# Patient Record
Sex: Male | Born: 1965 | Race: White | Hispanic: No | Marital: Married | Smoking: Never smoker
Health system: Southern US, Community
[De-identification: ages and names within clinical notes are randomized; demographics above are authoritative.]

## PROBLEM LIST (undated history)

## (undated) DIAGNOSIS — T4145XA Adverse effect of unspecified anesthetic, initial encounter: Secondary | ICD-10-CM

## (undated) DIAGNOSIS — E785 Hyperlipidemia, unspecified: Secondary | ICD-10-CM

## (undated) DIAGNOSIS — T8859XA Other complications of anesthesia, initial encounter: Secondary | ICD-10-CM

## (undated) HISTORY — DX: Hyperlipidemia, unspecified: E78.5

---
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
## 2010-04-08 ENCOUNTER — Ambulatory Visit: Payer: Self-pay | Admitting: Internal Medicine

## 2010-05-04 DIAGNOSIS — F411 Generalized anxiety disorder: Secondary | ICD-10-CM | POA: Insufficient documentation

## 2010-05-04 DIAGNOSIS — E785 Hyperlipidemia, unspecified: Secondary | ICD-10-CM | POA: Insufficient documentation

## 2010-05-05 DIAGNOSIS — J45909 Unspecified asthma, uncomplicated: Secondary | ICD-10-CM | POA: Insufficient documentation

## 2010-05-19 NOTE — Assessment & Plan Note (Signed)
Summary: Pulmoary consultation/ cough ? how much asthma   Visit Type:  Initial Consult Copy to:  Dr. Lemont Fillers Primary Provider/Referring Provider:  Dr. Jackalyn Lombard  CC:  Cough.  History of Present Illness: 50 yowm never smoker with h/o allergies / asthma stopped around the 15 learned to control it by avoiding irritants like cats/dogs/livestock  hay but still needed a as needed inhaler (no more than 2-3 x yearly and maybe prednsone for a week at  most)  May 05, 2010   1st pulmonary office eval for new cough since Oct 2011 attributed to cold rx sev rounds of  abx and  prednisone plus new rx with singulair, allegra, symbiocort and 100% better until traveled to [place] but when arrived back in [place] relapsed again with main c/o cough rattling minimal mucus clear.  no better with saba.  Pt denies any significant sore throat, dysphagia, itching, sneezing,  nasal congestion or excess secretions,  fever, chills, sweats, unintended wt loss, pleuritic or exertional cp, hempoptysis, change in activity tolerance  orthopnea pnd or leg swelling. Pt also denies any obvious fluctuation in symptoms with weather or environmental change or other alleviating or aggravating factors.       Current Medications (verified): 1)  Proair Hfa 108 (90 Base) Mcg/act Aers (Albuterol Sulfate) .... 2 Puffs Every 4-6 Hrs As Needed 2)  Fluticasone Propionate 50 Mcg/act Susp (Fluticasone Propionate) .... 2 Sprays Each Nostril Every Am 3)  Singulair 10 Mg Tabs (Montelukast Sodium) .Marland Kitchen.. 1 Once Daily 4)  Allegra Allergy 60 Mg Tabs (Fexofenadine Hcl) .Marland Kitchen.. 1 Once Daily 5)  Symbicort 160-4.5 Mcg/act Aero (Budesonide-Formoterol Fumarate) .... 2 Puffs Two Times A Day  Allergies (verified): No Known Drug Allergies  Past History:  Past Medical History: Asthma Hyperlipidemia  Family History: Asthma- Paternal Uncle  Social History: Married Children Never smoked cigs.  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  The patient denies coughing up blood, chest pain, irregular heartbeats, acid heartburn, indigestion, loss of appetite, weight change, abdominal pain, difficulty swallowing, sore throat, tooth/dental problems, headaches, sneezing, itching, ear ache, anxiety, depression, hand/feet swelling, joint stiffness or pain, rash, change in color of mucus, and fever.    Vital Signs:  Patient profile:   45 year old male Height:      70 inches Weight:      225 pounds BMI:     32.40 O2 Sat:      97 % on Room air Temp:     97.8 degrees F oral Pulse rate:   82 / minute BP sitting:   124 / 88  (left arm)  Vitals Entered By: Vernie Murders (May 05, 2010 11:33 AM)  O2 Flow:  Room air  Physical Exam  Additional Exam:  Pleasant healthy appearing mildly hoarse wm nad HEENT: nl dentition, turbinates, and orophanx. Nl external ear canals without cough reflex NECK :  without JVD/Nodes/TM/ nl carotid upstrokes bilaterally LUNGS: no acc muscle use, clear to A and P bilaterally without cough on insp or exp maneuvers CV:  RRR  no s3 or murmur or increase in P2, no edema  ABD:  soft and nontender with nl excursion in the supine position. No bruits or organomegaly, bowel sounds nl MS:  warm without  deformities, calf tenderness, cyanosis or clubbing SKIN: warm and dry without lesions   NEURO:  alert, approp, no deficits     Impression & Recommendations:  Problem # 1:  ASTHMA (ICD-493.90)  Previously only intermittent and well controlled, now chronic and poorly controlled ? why?  DDX of  difficult airways managment all start with A and  include Adherence, Ace Inhibitors, Acid Reflux, Active Sinus Disease, Alpha 1 Antitripsin deficiency, Anxiety  masquerading as Airways dz,  ABPA,  allergy(esp in young), Aspiration (esp in elderly), Adverse effects of DPI,  Active smokers, plus one B  = Beta blocker use..  and one C=CHF  Adherence:  I spent extra time with the patient today explaining optimal mdi  technique.  This improved from  25-75%  ? acid reflux:  always a concern. See instructions for specific recommendations   ? Active sinus dz > sinus ct next ? allergy> allergy profile next, clearly not singulair responsive so stop this.   ? anxiety: dx of exclusion  Medications Added to Medication List This Visit: 1)  Proair Hfa 108 (90 Base) Mcg/act Aers (Albuterol sulfate) .... 2 puffs every 4-6 hrs as needed 2)  Fluticasone Propionate 50 Mcg/act Susp (Fluticasone propionate) .... 2 sprays each nostril every am 3)  Singulair 10 Mg Tabs (Montelukast sodium) .Marland Kitchen.. 1 once daily 4)  Allegra Allergy 60 Mg Tabs (Fexofenadine hcl) .Marland Kitchen.. 1 once daily 5)  Symbicort 160-4.5 Mcg/act Aero (Budesonide-formoterol fumarate) .... 2 puffs two times a day 6)  Dulera 100-5 Mcg/act Aero (Mometasone furo-formoterol fum) .... 2 puffs first thing  in am and 2 puffs again in pm about 12 hours later  Other Orders: Consultation Level V (16109)  Patient Instructions: 1)  Dulera 100 mg 2 puffs first thing  in am and 2 puffs again in pm about 12 hours later and blow back out through nose 2)  Prilosec before bfast and pepcid 20 mg at bedtime as long as coughing ( reflux is to cough what oxygen is to fire)  3)  GERD (REFLUX)  is a common cause of respiratory symptoms. It commonly presents without heartburn and can be treated with medication, but also with lifestyle changes including avoidance of late meals, excessive alcohol, smoking cessation, and avoid fatty foods, chocolate, peppermint, colas, red wine, and acidic juices such as orange juice. NO MINT OR MENTHOL PRODUCTS SO NO COUGH DROPS  4)  USE SUGARLESS CANDY INSTEAD (jolley ranchers)  5)  NO OIL BASED VITAMINS    6)  Delsym 2 tsp every 12 hours as needed for cough

## 2010-06-09 ENCOUNTER — Ambulatory Visit: Payer: Self-pay | Admitting: Internal Medicine

## 2011-01-18 ENCOUNTER — Ambulatory Visit (INDEPENDENT_AMBULATORY_CARE_PROVIDER_SITE_OTHER): Payer: BC Managed Care – PPO | Admitting: Adult Health

## 2011-01-18 ENCOUNTER — Encounter: Payer: Self-pay | Admitting: Adult Health

## 2011-01-18 VITALS — BP 128/78 | HR 75 | Temp 97.5°F | Ht 70.0 in | Wt 225.6 lb

## 2011-01-18 DIAGNOSIS — J45909 Unspecified asthma, uncomplicated: Secondary | ICD-10-CM

## 2011-01-18 MED ORDER — HYDROCODONE-HOMATROPINE 5-1.5 MG/5ML PO SYRP: 5.0000 mL | ORAL_SOLUTION | Freq: Four times a day (QID) | ORAL | Status: AC | PRN

## 2011-01-18 MED ORDER — ALBUTEROL SULFATE HFA 108 (90 BASE) MCG/ACT IN AERS: 2.0000 | INHALATION_SPRAY | RESPIRATORY_TRACT | Status: DC | PRN

## 2011-01-18 MED ORDER — FLUTICASONE PROPIONATE 50 MCG/ACT NA SUSP: 2.0000 | Freq: Every day | NASAL | Status: DC

## 2011-01-18 MED ORDER — MOMETASONE FURO-FORMOTEROL FUM 100-5 MCG/ACT IN AERO: 2.0000 | INHALATION_SPRAY | Freq: Two times a day (BID) | RESPIRATORY_TRACT | Status: DC

## 2011-01-18 NOTE — Patient Instructions (Signed)
Remain on  Dulera 2 puffs Twice daily  -brush/rinse and gargle after use.  Delsym 2 tsp Twice daily  As needed  Cough  Hydromet 1-2 tsp every 6 hr As needed  Cough, may make you sleepy.  Fluids and rest  Zyrtec 10mg  At bedtime  As needed  Drainage.  follow up Dr. Sherene Sires  In 4 -6 weeks with PFTs  Please contact office for sooner follow up if symptoms do not improve or worsen or seek emergency care

## 2011-01-25 NOTE — Assessment & Plan Note (Signed)
Mild flare   Plan;  Remain on  Dulera 2 puffs Twice daily  -brush/rinse and gargle after use.  Delsym 2 tsp Twice daily  As needed  Cough  Hydromet 1-2 tsp every 6 hr As needed  Cough, may make you sleepy.  Fluids and rest  Zyrtec 10mg  At bedtime  As needed  Drainage.  follow up Dr. Sherene Sires  In 4 -6 weeks with PFTs  Please contact office for sooner follow up if symptoms do not improve or worsen or seek emergency care

## 2011-01-25 NOTE — Progress Notes (Signed)
  Subjective:    Patient ID: Byford Schools, male    DOB: 21-Aug-1965, 45 y.o.   MRN: [id number]  HPI 87 yowm never smoker with h/o allergies / asthma stopped around the 15 learned to control it by avoiding irritants like cats/dogs/livestock hay but still needed a as needed inhaler (no more than 2-3 x yearly and maybe prednsone for a week at most)   May 05, 2010 1st pulmonary office eval for new cough since Oct 2011 attributed to cold rx sev rounds of abx and prednisone plus new rx with singulair, allegra, symbiocort and 100% better until traveled to [place] but when arrived back in [place] relapsed again with main c/o cough rattling minimal mucus clear. no better with saba.   >>Dulera rx   01/18/2011 Acute OV  Complains of cold symptoms that started last week. Cough is worse at night, keeping her up. Mostly non prod with some chest tightness and wheezing. She restarted Dulera and flonase 3 days ago which are helping. No discolored mucus. No fever or recent travel or abx use. Did welll last visit then stopped Dulera.    Review of Systems Constitutional:   No  weight loss, night sweats,  Fevers, chills, fatigue, or  lassitude.  HEENT:   No headaches,  Difficulty swallowing,  Tooth/dental problems, or  Sore throat,                No sneezing, itching, ear ache,  +nasal congestion, post nasal drip,   CV:  No chest pain,  Orthopnea, PND, swelling in lower extremities, anasarca, dizziness, palpitations, syncope.   GI  No heartburn, indigestion, abdominal pain, nausea, vomiting, diarrhea, change in bowel habits, loss of appetite, bloody stools.   Resp:    No coughing up of blood.   No chest wall deformity  Skin: no rash or lesions.  GU: no dysuria, change in color of urine, no urgency or frequency.  No flank pain, no hematuria   MS:  No joint pain or swelling.  No decreased range of motion.  No back pain.  Psych:  No change in mood or affect. No depression or anxiety.  No memory loss.         Objective:   Physical Exam GEN: A/Ox3; pleasant , NAD,    HEENT:  [place]/AT,  EACs-clear, TMs-wnl, NOSE-clear, THROAT-clear, no lesions, no postnasal drip or exudate noted.   NECK:  Supple w/ fair ROM; no JVD; normal carotid impulses w/o bruits; no thyromegaly or nodules palpated; no lymphadenopathy.  RESP  Coarse BS  w/o, wheezes/ rales/ or rhonchi.no accessory muscle use, no dullness to percussion  CARD:  RRR, no m/r/g  , no peripheral edema, pulses intact, no cyanosis or clubbing.  GI:   Soft & nt; nml bowel sounds; no organomegaly or masses detected.  Musco: Warm bil, no deformities or joint swelling noted.   Neuro: alert, no focal deficits noted.    Skin: Warm, no lesions or rashes         Assessment & Plan:

## 2011-03-06 ENCOUNTER — Ambulatory Visit: Payer: BC Managed Care – PPO | Admitting: Internal Medicine

## 2011-08-09 ENCOUNTER — Encounter: Payer: Self-pay | Admitting: Internal Medicine

## 2011-08-09 ENCOUNTER — Ambulatory Visit (INDEPENDENT_AMBULATORY_CARE_PROVIDER_SITE_OTHER): Payer: BC Managed Care – PPO | Admitting: Internal Medicine

## 2011-08-09 VITALS — BP 110/70 | HR 81 | Temp 98.2°F | Ht 70.0 in | Wt 221.0 lb

## 2011-08-09 DIAGNOSIS — J45909 Unspecified asthma, uncomplicated: Secondary | ICD-10-CM

## 2011-08-09 MED ORDER — MOMETASONE FURO-FORMOTEROL FUM 200-5 MCG/ACT IN AERO: INHALATION_SPRAY | RESPIRATORY_TRACT | Status: DC

## 2011-08-09 MED ORDER — PREDNISONE (PAK) 10 MG PO TABS: ORAL_TABLET | ORAL | Status: AC

## 2011-08-09 NOTE — Patient Instructions (Addendum)
Dulera 200 Take 2 puffs first thing in am and then another 2 puffs about 12 hours later at onset of the cough  Prednisone 10 mg take  4 each am x 2 days,   2 each am x 2 days,  1 each am x2days and stop   If cough persists despite the prednisone, start Try prilosec 20mg   Take 30-60 min before first meal of the day and Pepcid 20 mg one bedtime until cough is completely gone for at least a week without the need for cough suppression   If you are satisfied with your treatment plan let your doctor know and he/she can either refill your medications or you can return here when your prescription runs out.     If in any way you are not 100% satisfied,  please tell us.  If 100% better, tell your friends!

## 2011-08-09 NOTE — Progress Notes (Signed)
  Subjective:    Patient ID: Willie Jenkins, male    DOB: [birth year]    MRN: [id number]  HPI 57  yowm never smoker with h/o allergies / asthma stopped around the age of 26 learned to control it by avoiding irritants like cats/dogs/livestock hay but still needed prn  inhaler (no more than 2-3 x yearly and maybe prednsone for a week at most).  May 05, 2010 1st pulmonary office eval for new cough since Oct 2011 attributed to cold rx sev rounds of abx and prednisone plus new rx with singulair, allegra, symbiocort and 100% better until traveled to [place] but when arrived back in [place] relapsed again with main c/o cough rattling minimal mucus clear. no better with saba.   >>Dulera rx     08/09/2011 f/u ov/Shalah Estelle excellent symptom control with no need for maint rx cc abuptly ill x one month nasal congestion, sore throat,  Then increase cough min prod beige mucus started right away on dulera 100 2 bid and although consistent technique x one week prior to OV till feel need for rescue saba twice daily with no overt hb or reflux symptoms  Sleeping ok without nocturnal  or early am exacerbation  of respiratory  c/o's or need for noct saba. Also denies any obvious fluctuation of symptoms with weather or environmental changes or other aggravating or alleviating factors except as outlined above   ROS  At present neg for  any significant sore throat, dysphagia, dental problems, itching, sneezing,  nasal congestion or excess/ purulent secretions, ear ache,   fever, chills, sweats, unintended wt loss, pleuritic or exertional cp, hemoptysis, palpitations, orthopnea pnd or leg swelling.  Also denies presyncope, palpitations, heartburn, abdominal pain, anorexia, nausea, vomiting, diarrhea  or change in bowel or urinary habits, change in stools or urine, dysuria,hematuria,  rash, arthralgias, visual complaints, headache, numbness weakness or ataxia or problems with walking or coordination. No noted change in mood/affect or  memory.           Objective:   Physical Exam GEN: A/Ox3; pleasant , NAD,    HEENT:  [place]/AT,  EACs-clear, TMs-wnl, NOSE-clear, THROAT-clear, no lesions, no postnasal drip or exudate noted.   NECK:  Supple w/ fair ROM; no JVD; normal carotid impulses w/o bruits; no thyromegaly or nodules palpated; no lymphadenopathy.  RESP   Trace wheezes/ rales/ or rhonchi.no accessory muscle use, no dullness to percussion  CARD:  RRR, no m/r/g  , no peripheral edema, pulses intact, no cyanosis or clubbing.  GI:   Soft & nt; nml bowel sounds; no organomegaly or masses detected.  Musco: Warm bil, no deformities or joint swelling noted.   Neuro: alert, no focal deficits noted.    Skin: Warm, no lesions or rashes         Assessment & Plan:

## 2011-08-10 NOTE — Assessment & Plan Note (Addendum)
Acute exac not responding to dulera 100 2bid  DDX of  difficult airways managment all start with A and  include Adherence, Ace Inhibitors, Acid Reflux, Active Sinus Disease, Alpha 1 Antitripsin deficiency, Anxiety masquerading as Airways dz,  ABPA,  allergy(esp in young), Aspiration (esp in elderly), Adverse effects of DPI,  Active smokers, plus two Bs  = Bronchiectasis and Beta blocker use..and one C= CHF  The proper method of use, as well as anticipated side effects, of a metered-dose inhaler are discussed and demonstrated to the patient. Improved effectiveness after extensive coaching during this visit to a level of approximately  90% - try dulera 200 but note risk cough with higher dose ICS due to irritation of upper airway outweighing benefit to lower airway  ? Acid reflux > reviewed possible need for gerd rx if symptoms continue ? Anxiety > dx of exclusion

## 2011-08-25 ENCOUNTER — Ambulatory Visit (INDEPENDENT_AMBULATORY_CARE_PROVIDER_SITE_OTHER)
Admission: RE | Admit: 2011-08-25 | Discharge: 2011-08-25 | Disposition: A | Discharge status: Home / Self Care | Payer: BC Managed Care – PPO | Source: Ambulatory Visit | Attending: Adult Health | Admitting: Adult Health

## 2011-08-25 ENCOUNTER — Ambulatory Visit (INDEPENDENT_AMBULATORY_CARE_PROVIDER_SITE_OTHER): Payer: BC Managed Care – PPO | Admitting: Adult Health

## 2011-08-25 ENCOUNTER — Encounter: Payer: Self-pay | Admitting: Adult Health

## 2011-08-25 VITALS — BP 116/88 | HR 93 | Temp 97.4°F | Ht 70.0 in | Wt 224.6 lb

## 2011-08-25 DIAGNOSIS — J45909 Unspecified asthma, uncomplicated: Secondary | ICD-10-CM

## 2011-08-25 NOTE — Patient Instructions (Signed)
Increase Dulera 200/63mcg 2 puffs in am and 2 puffs in pm (12 hrs apart) -brush/rinse/gargle Begin Prilosec 20mg  daily before meal x 10 days  Begin Pepcid 20mg  At bedtime  X 10 days  Delsym 2 tsp Twice daily  As needed  Cough  I will call with xray results.  Follow up in 4 weeks with Dr. Sherene Sires  And As needed   Please contact office for sooner follow up if symptoms do not improve or worsen or seek emergency care

## 2011-08-25 NOTE — Progress Notes (Signed)
Subjective:    Patient ID: Willie Jenkins, male    DOB: 02-Jun-1965    MRN: [id number]  HPI 45  yowm never smoker with h/o allergies / asthma stopped around the age of 56 learned to control it by avoiding irritants like cats/dogs/livestock hay but still needed prn  inhaler (no more than 2-3 x yearly and maybe prednsone for a week at most).  May 05, 2010 1st pulmonary office eval for new cough since Oct 2011 attributed to cold rx sev rounds of abx and prednisone plus new rx with singulair, allegra, symbiocort and 100% better until traveled to [place] but when arrived back in [place] relapsed again with main c/o cough rattling minimal mucus clear. no better with saba.   >>Dulera rx   08/09/2011 f/u ov/[facility] excellent symptom control with no need for maint rx cc abuptly ill x one month nasal congestion, sore throat,  Then increase cough min prod beige mucus started right away on dulera 100 2 bid and although consistent technique x one week prior to OV till feel need for rescue saba twice daily with no overt hb or reflux symptoms >>Prednisone taper.   08/25/2011 Acute OV  Complains of dry cough, some residual wheezing and tightness in chest and SOB, but reports some improvement. Cough is better. Wheezing is almost gone. Still feels unable to get in good air flow.  No exertional chest pain, dyspnea, syncope or diaphoresis. No calf pain or swelling.  Travels frequently. No fever, discolored mucus or post nasal drip .  Spirometry today is normal  Taking Dulera 100 (did not go up to 200 as recommended. ) Taking in am and at noon.  Did not start PPI /pepcid combo.    ROS:  Constitutional:   No  weight loss, night sweats,  Fevers, chills, fatigue, or  lassitude.  HEENT:   No headaches,  Difficulty swallowing,  Tooth/dental problems, or  Sore throat,                No sneezing, itching, ear ache, nasal congestion, post nasal drip,   CV:  No chest pain,  Orthopnea, PND, swelling in lower extremities,  anasarca, dizziness, palpitations, syncope.   GI  No heartburn, indigestion, abdominal pain, nausea, vomiting, diarrhea, change in bowel habits, loss of appetite, bloody stools.   Resp:   No excess mucus, no productive cough,    No coughing up of blood.  No change in color of mucus.  No wheezing.  No chest wall deformity  Skin: no rash or lesions.  GU: no dysuria, change in color of urine, no urgency or frequency.  No flank pain, no hematuria   MS:  No joint pain or swelling.  No decreased range of motion.  No back pain.  Psych:  No change in mood or affect. No depression or anxiety.  No memory loss.                Objective:   Physical Exam GEN: A/Ox3; pleasant , NAD,    HEENT:  [place]/AT,  EACs-clear, TMs-wnl, NOSE-clear, THROAT-clear, no lesions, no postnasal drip or exudate noted.   NECK:  Supple w/ fair ROM; no JVD; normal carotid impulses w/o bruits; no thyromegaly or nodules palpated; no lymphadenopathy.  RESP   CTA w/out wheezes/ rales/ or rhonchi.no accessory muscle use, no dullness to percussion  CARD:  RRR, no m/r/g  , no peripheral edema, pulses intact, no cyanosis or clubbing.  GI:   Soft & nt; nml bowel sounds; no  organomegaly or masses detected.  Musco: Warm bil, no deformities or joint swelling noted.   Neuro: alert, no focal deficits noted.    Skin: Warm, no lesions or rashes         Assessment & Plan:

## 2011-08-25 NOTE — Assessment & Plan Note (Signed)
Slow to resolve flare  Spirometry with nml lung vol. -preserved fxn  CxR today is pending.   Plan:  Increase Dulera 200/19mcg 2 puffs in am and 2 puffs in pm (12 hrs apart) -brush/rinse/gargle Begin Prilosec 20mg  daily before meal x 10 days  Begin Pepcid 20mg  At bedtime  X 10 days  Delsym 2 tsp Twice daily  As needed  Cough  I will call with xray results.  Follow up in 4 weeks with Dr. Sherene Sires  And As needed   Please contact office for sooner follow up if symptoms do not improve or worsen or seek emergency care

## 2011-09-28 ENCOUNTER — Encounter: Payer: BC Managed Care – PPO | Admitting: Internal Medicine

## 2011-09-28 NOTE — Progress Notes (Signed)
 This encounter was created in error - please disregard.

## 2012-09-17 IMAGING — CR DG CHEST 2V
2 series · 2 of 2 positions shown · non-contrast
Comparison: None.

CLINICAL DATA: Tightness in the chest.  Dyspnea.  Coughing.
Shortness of breath.  Chest pain.  Asthma.

CHEST - 2 VIEW

[view not recorded (1 of 2)]
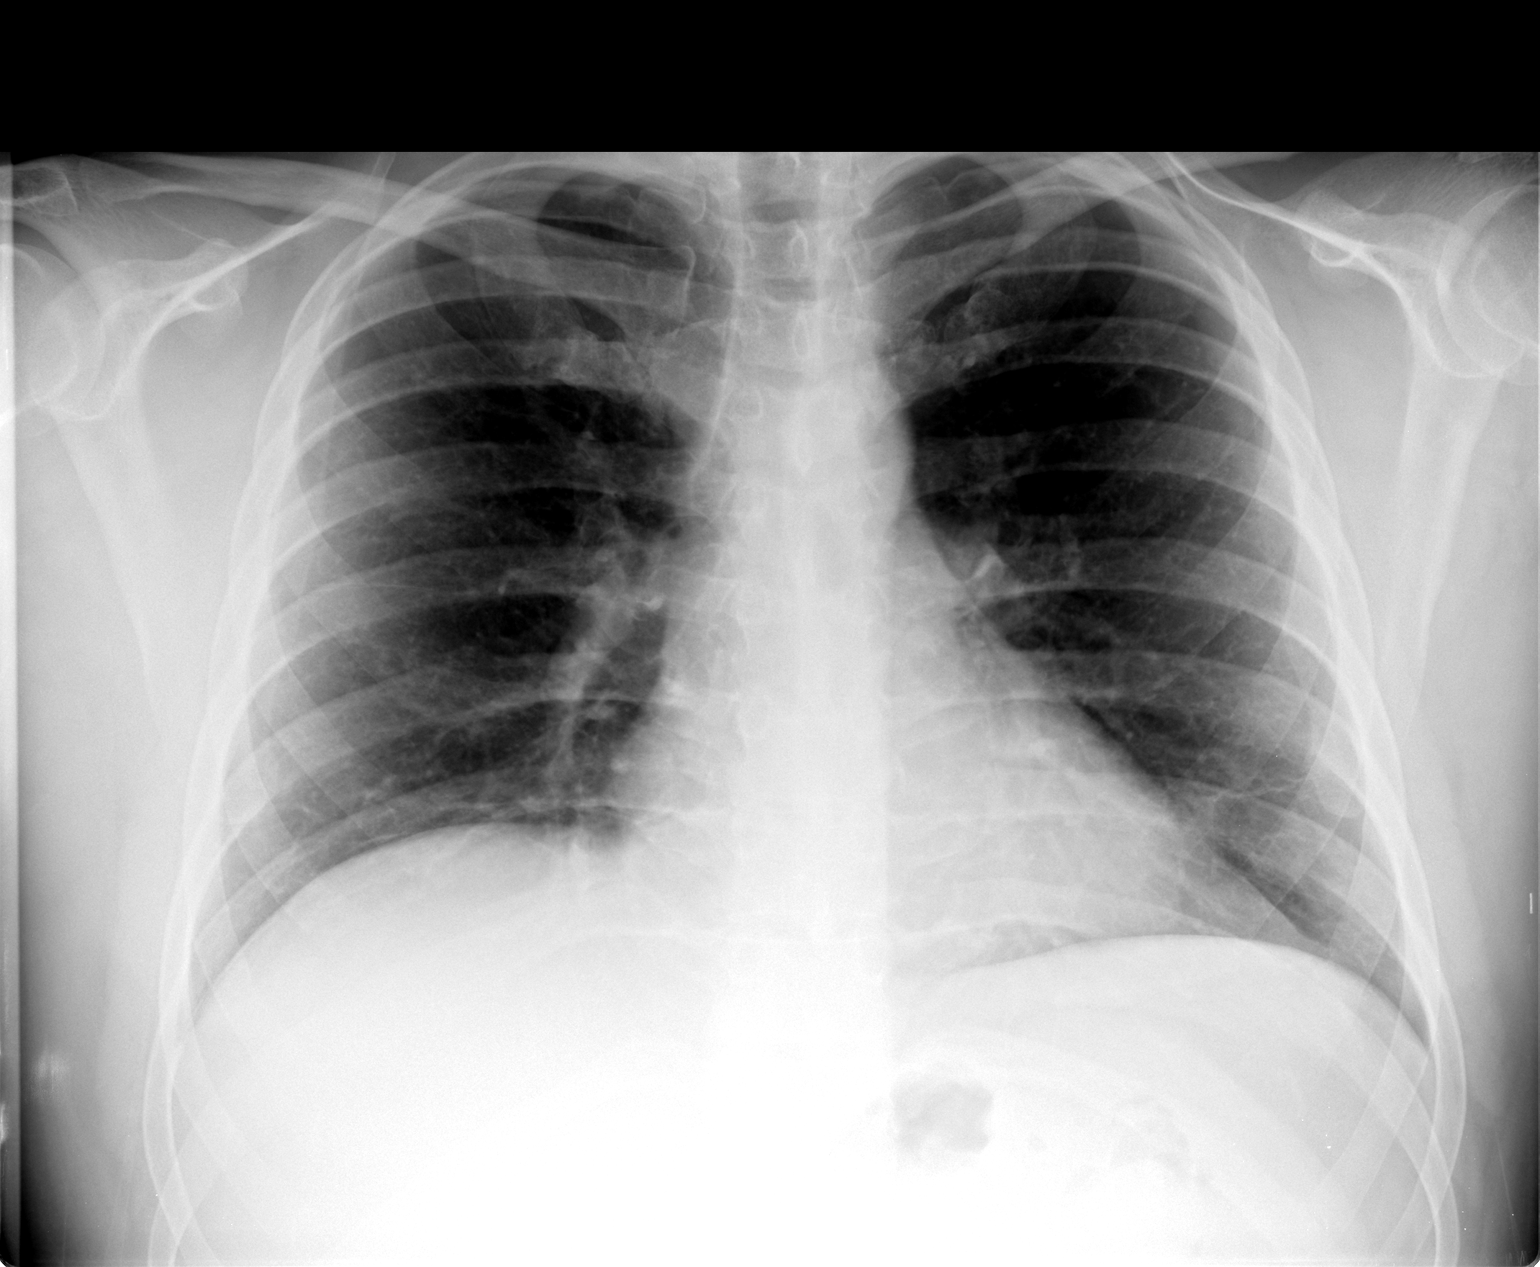

[view not recorded (2 of 2)]
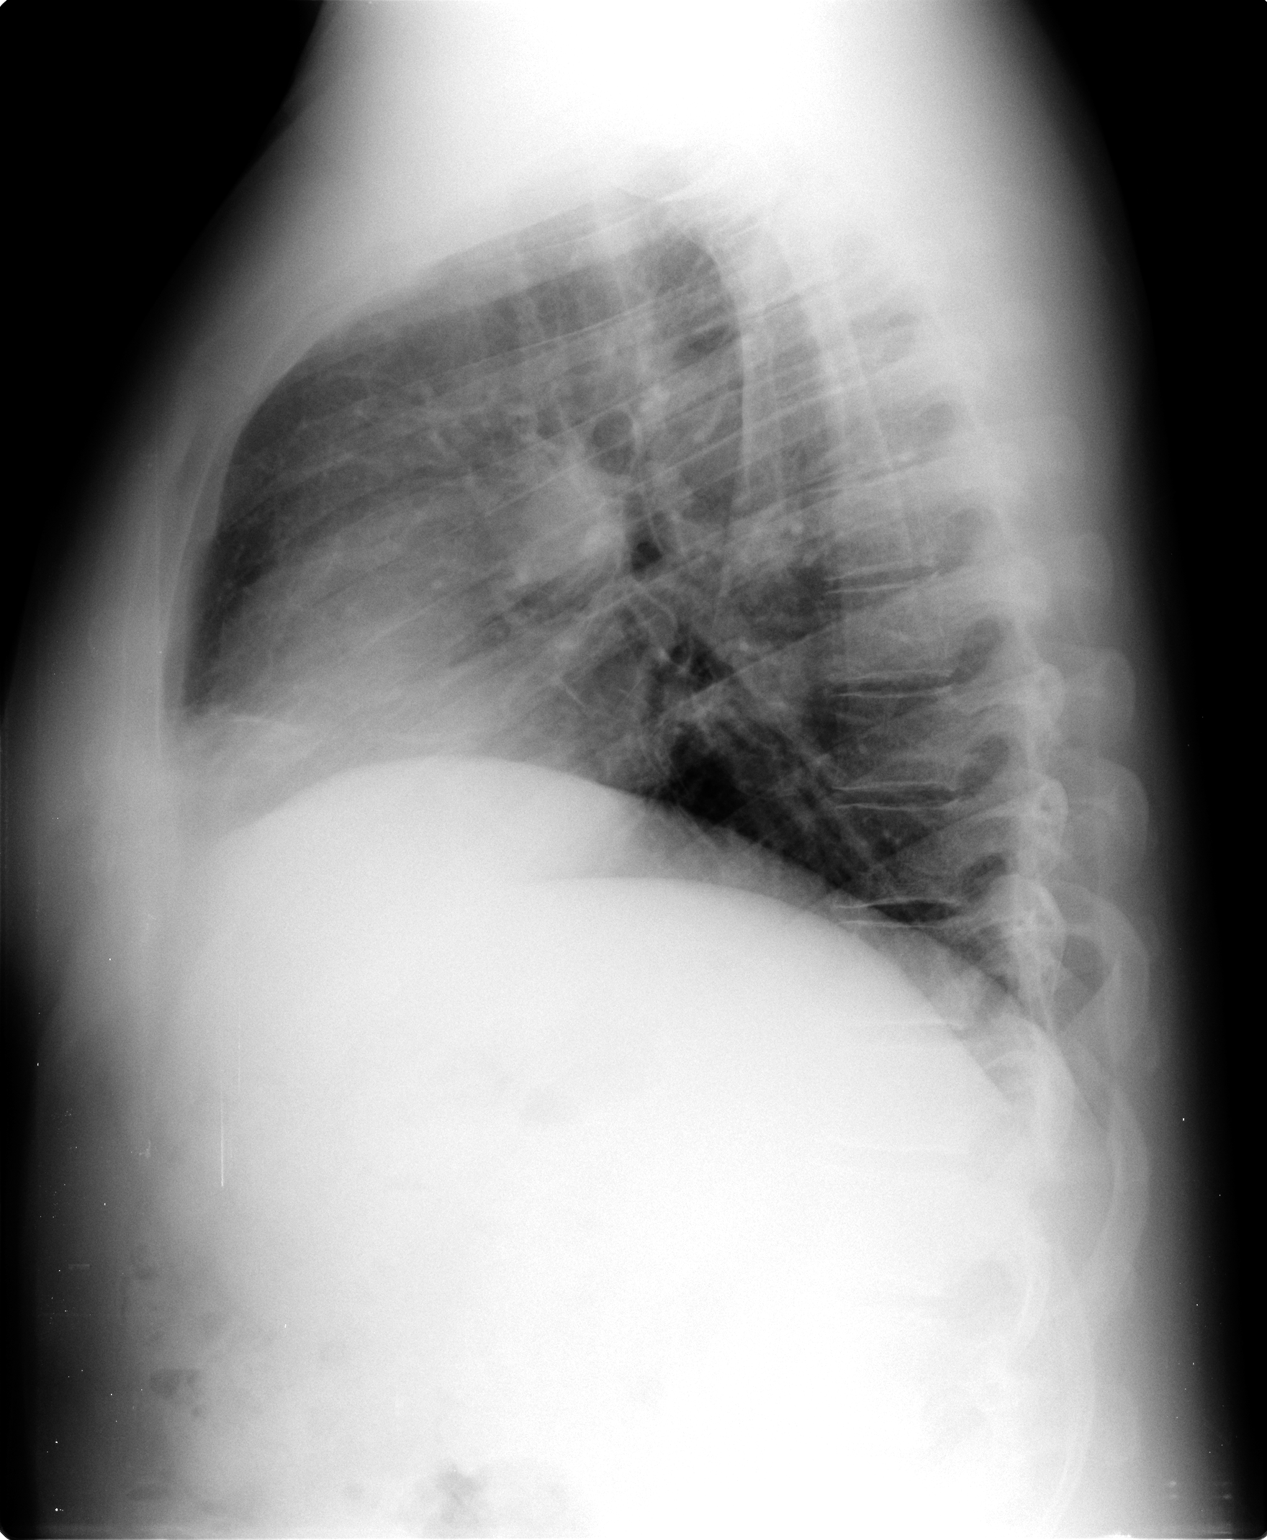

[2 of 2 positions shown; findings below may reference images not displayed]

FINDINGS: There is normal size of the cardiac silhouette.
Mediastinal and hilar structures appear normal.  No pulmonary
infiltrates or nodules were evident. No pleural abnormality is
evident. Bones appear average for age.
IMPRESSION: No acute or active cardiopulmonary or pleural abnormalities are
evident.

## 2013-05-14 ENCOUNTER — Encounter: Payer: Self-pay | Admitting: Internal Medicine

## 2013-05-14 ENCOUNTER — Ambulatory Visit (INDEPENDENT_AMBULATORY_CARE_PROVIDER_SITE_OTHER): Payer: BC Managed Care – PPO | Admitting: Internal Medicine

## 2013-05-14 VITALS — BP 120/82 | HR 78 | Temp 97.5°F | Ht 70.0 in | Wt 224.0 lb

## 2013-05-14 DIAGNOSIS — J45909 Unspecified asthma, uncomplicated: Secondary | ICD-10-CM

## 2013-05-14 MED ORDER — MOMETASONE FURO-FORMOTEROL FUM 200-5 MCG/ACT IN AERO: INHALATION_SPRAY | RESPIRATORY_TRACT | Status: DC

## 2013-05-14 MED ORDER — PREDNISONE 10 MG PO TABS: ORAL_TABLET | ORAL | Status: DC

## 2013-05-14 NOTE — Assessment & Plan Note (Signed)
-  Spirometry 08/25/11 >FEV1 3.63L /89%, ratio 85   DDX of  difficult airways managment all start with A and  include Adherence, Ace Inhibitors, Acid Reflux, Active Sinus Disease, Alpha 1 Antitripsin deficiency, Anxiety masquerading as Airways dz,  ABPA,  allergy(esp in young), Aspiration (esp in elderly), Adverse effects of DPI,  Active smokers, plus two Bs  = Bronchiectasis and Beta blocker use..and one C= CHF   Adherence is always the initial "prime suspect" and is a multilayered concern that requires a "trust but verify" approach in every patient - starting with knowing how to use medications, especially inhalers, correctly, keeping up with refills and understanding the fundamental difference between maintenance and prns vs those medications only taken for a very short course and then stopped and not refilled.  The proper method of use, as well as anticipated side effects, of a metered-dose inhaler are discussed and demonstrated to the patient. Improved effectiveness after extensive coaching during this visit to a level of approximately  90%  ? Acid (or non-acid) GERD > always difficult to exclude as up to 75% of pts in some series report no assoc GI/ Heartburn symptoms> rec max (24h)  acid suppression and diet restrictions/ reviewed and instructions given in writing.   See instructions for specific recommendations which were reviewed directly with the patient who was given a copy with highlighter outlining the key components.

## 2013-05-14 NOTE — Patient Instructions (Addendum)
Prednisone 10 mg take  4 each am x 2 days,   2 each am x 2 days,  1 each am x 2 days and stop   Ok to wait until you have symptoms then start on dulera 200 Take 2 puffs first thing in am and then another 2 puffs about 12 hours later and taper off as soon as you feel better  For cough> immediately start delsym 2 tsp every 12 hours as needed and start   prilosec 20mg   Take 30-60 min before first meal of the day and Pepcid 20 mg one bedtime until cough is completely gone for at least a week without the need for cough suppression   GERD (REFLUX)  is an extremely common cause of respiratory symptoms, many times with no significant heartburn at all.    It can be treated with medication, but also with lifestyle changes including avoidance of late meals, excessive alcohol, smoking cessation, and avoid fatty foods, chocolate, peppermint, colas, red wine, and acidic juices such as orange juice.  NO MINT OR MENTHOL PRODUCTS SO NO COUGH DROPS  USE SUGARLESS CANDY INSTEAD (jolley ranchers or [employer])  NO OIL BASED VITAMINS - use powdered substitutes.    Call if not satisfied

## 2013-05-14 NOTE — Progress Notes (Signed)
Subjective:   Patient ID: Willie BarnacleSeth Jenkins, male    DOB: 11/04/1965    MRN: [id number]    Brief patient profile:  [id number]  yowm never smoker with h/o allergies / asthma stopped around the age of 48 learned to control it by avoiding irritants like cats/dogs/livestock hay but still needed prn  inhaler (no more than 2-3 x yearly and maybe prednsone for a week at most).   History of Present Illness  May 05, 2010 1st pulmonary office eval for new cough since Oct 2011 attributed to cold rx sev rounds of abx and prednisone plus new rx with singulair, allegra, symbiocort and 100% better until traveled to [place] but when arrived back in [place] relapsed again with main c/o cough rattling minimal mucus clear. no better with saba.   >>Dulera rx   08/09/2011 f/u ov/Willie Jenkins excellent symptom control with no need for maint rx cc abuptly ill x one month nasal congestion, sore throat,  Then increase cough min prod beige mucus started right away on dulera 100 2 bid and although consistent technique x one week prior to OV till feel need for rescue saba twice daily with no overt hb or reflux symptoms >>Prednisone taper.   08/25/2011 Acute OV  Complains of dry cough, some residual wheezing and tightness in chest and SOB, but reports some improvement. Cough is better. Wheezing is almost gone. Still feels unable to get in good air flow.  No exertional chest pain, dyspnea, syncope or diaphoresis. No calf pain or swelling.  Travels frequently. No fever, discolored mucus or post nasal drip .  Spirometry today is normal  Taking Dulera 100 (did not go up to 200 as recommended. ) Taking in am and at noon.  Did not start PPI /pepcid combo.  rec Increase Dulera 200/295mcg 2 puffs in am and 2 puffs in pm (12 hrs apart) -brush/rinse/gargle Begin Prilosec 20mg  daily before meal x 10 days  Begin Pepcid 20mg  At bedtime  X 10 days  Delsym 2 tsp Twice daily  As needed  Cough     09/28/2011 f/u ov/Willie Jenkins no show  05/14/2013 f/u ov/Willie Jenkins re:  recurrent cough  Chief Complaint  Patient presents with  . Acute Visit    Pt c/o cough since mid Dec 2014. Cough is non prod.  He also c/o occ SOB at night. He is using rescue inhaler approx twice per wk.    Months at a time needs nothing at all including doing jet travel but sev to three times a year has dulera 200 2 every 12 hours and flonase to use prn and typically worse but not with this episode already rx with pred x one shot in Dec 2014.  Cough and breathing worse at hs despite good hfa technique  No obvious day to day or daytime variabilty or assoc   cp or chest tightness, subjective wheeze overt sinus or hb symptoms. No unusual exp hx or h/o childhood pna/ asthma or knowledge of premature birth.   Also denies any obvious fluctuation of symptoms with weather or environmental changes or other aggravating or alleviating factors except as outlined above   Current Medications, Allergies, Complete Past Medical History, Past Surgical History, Family History, and Social History were reviewed in [facility] electronic medical record.  ROS  The following are not active complaints unless bolded sore throat, dysphagia, dental problems, itching, sneezing,  nasal congestion or excess/ purulent secretions, ear ache,   fever, chills, sweats, unintended wt loss, pleuritic or exertional cp, hemoptysis,  orthopnea pnd or leg swelling, presyncope, palpitations, heartburn, abdominal pain, anorexia, nausea, vomiting, diarrhea  or change in bowel or urinary habits, change in stools or urine, dysuria,hematuria,  rash, arthralgias, visual complaints, headache, numbness weakness or ataxia or problems with walking or coordination,  change in mood/affect or memory.                            Objective:   Physical Exam GEN: A/Ox3; pleasant , harsh barking quality cough  Wt Readings from Last 3 Encounters:  05/14/13 224 lb (101.606 kg)  08/25/11 224 lb 9.6 oz (101.878 kg)  08/09/11 221 lb  (100.245 kg)      HEENT:  [place]/AT,  EACs-clear, TMs-wnl, NOSE-clear, THROAT-clear, no lesions, no postnasal drip or exudate noted.   NECK:  Supple w/ fair ROM; no JVD; normal carotid impulses w/o bruits; no thyromegaly or nodules palpated; no lymphadenopathy.  RESP   Bil End exp wheeze/ cough .no accessory muscle use, no dullness to percussion  CARD:  RRR, no m/r/g  , no peripheral edema, pulses intact, no cyanosis or clubbing.  GI:   Soft & nt; nml bowel sounds; no organomegaly or masses detected.  Musco: Warm bil, no deformities or joint swelling noted.   Neuro: alert, no focal deficits noted.    Skin: Warm, no lesions or rashes          Assessment & Plan:

## 2019-05-19 ENCOUNTER — Other Ambulatory Visit: Payer: Self-pay | Admitting: Gastroenterology

## 2019-06-06 ENCOUNTER — Other Ambulatory Visit (HOSPITAL_COMMUNITY)
Admission: RE | Admit: 2019-06-06 | Discharge: 2019-06-06 | Disposition: A | Discharge status: Home / Self Care | Payer: BC Managed Care – PPO | Source: Ambulatory Visit | Attending: Gastroenterology | Admitting: Gastroenterology

## 2019-06-06 NOTE — Progress Notes (Signed)
Patient tested positive for covid in January @ [facility]. No need for repeat covid prior to procedure due to positive within 90 days. Pt to bring copy of results for chart.

## 2019-06-09 NOTE — Progress Notes (Signed)
Spoke with Morrie Sheldon at Dr. Kenna Gilbert office, patient tested positive for covid 19 on January 7th.  Currently has no symptoms

## 2019-06-09 NOTE — Anesthesia Preprocedure Evaluation (Addendum)
Anesthesia Evaluation  Patient identified by MRN, date of birth, ID band Patient awake  General Assessment Comment:Hx of difficult airway at [facility] in [place] for appendectomy. Subsequently had another general anesthetic at same hospital and airway was better (per pt) but had prolonged emergence. Airway exam completely benign besides beard  Reviewed: Allergy & Precautions, NPO status , Patient's Chart, lab work & pertinent test results  History of Anesthesia Complications (+) DIFFICULT AIRWAY, PROLONGED EMERGENCE and history of anesthetic complications  Airway Mallampati: I  TM Distance: >3 FB Neck ROM: Full    Dental no notable dental hx. (+) Teeth Intact, Dental Advisory Given   Pulmonary asthma ,    Pulmonary exam normal breath sounds clear to auscultation       Cardiovascular negative cardio ROS Normal cardiovascular exam Rhythm:Regular Rate:Normal     Neuro/Psych PSYCHIATRIC DISORDERS Anxiety negative neurological ROS     GI/Hepatic negative GI ROS, Neg liver ROS,   Endo/Other  negative endocrine ROS  Renal/GU negative Renal ROS  negative genitourinary   Musculoskeletal negative musculoskeletal ROS (+)   Abdominal Normal abdominal exam  (+)   Peds negative pediatric ROS (+)  Hematology negative hematology ROS (+)   Anesthesia Other Findings HLD  Reproductive/Obstetrics negative OB ROS                            Anesthesia Physical Anesthesia Plan  ASA: II  Anesthesia Plan: MAC   Post-op Pain Management:    Induction:   PONV Risk Score and Plan: 2 and Propofol infusion and TIVA  Airway Management Planned: Natural Airway and Simple Face Mask  Additional Equipment: None  Intra-op Plan:   Post-operative Plan:   Informed Consent: I have reviewed the patients History and Physical, chart, labs and discussed the procedure including the risks, benefits and alternatives for  the proposed anesthesia with the patient or authorized representative who has indicated his/her understanding and acceptance.       Plan Discussed with: CRNA  Anesthesia Plan Comments:         Anesthesia Quick Evaluation

## 2019-06-10 ENCOUNTER — Encounter (HOSPITAL_COMMUNITY): Payer: Self-pay | Admitting: Gastroenterology

## 2019-06-10 ENCOUNTER — Ambulatory Visit (HOSPITAL_COMMUNITY): Payer: BC Managed Care – PPO | Admitting: Anesthesiology

## 2019-06-10 ENCOUNTER — Ambulatory Visit (HOSPITAL_COMMUNITY)
Admission: RE | Admit: 2019-06-10 | Discharge: 2019-06-10 | Disposition: A | Discharge status: Home / Self Care | Payer: BC Managed Care – PPO | Source: Ambulatory Visit | Attending: Gastroenterology | Admitting: Gastroenterology

## 2019-06-10 ENCOUNTER — Other Ambulatory Visit: Payer: Self-pay

## 2019-06-10 ENCOUNTER — Encounter (HOSPITAL_COMMUNITY)
Admission: RE | Disposition: A | Discharge status: Home / Self Care | Payer: Self-pay | Source: Ambulatory Visit | Attending: Gastroenterology

## 2019-06-10 DIAGNOSIS — E785 Hyperlipidemia, unspecified: Secondary | ICD-10-CM | POA: Diagnosis not present

## 2019-06-10 DIAGNOSIS — Z7952 Long term (current) use of systemic steroids: Secondary | ICD-10-CM | POA: Insufficient documentation

## 2019-06-10 DIAGNOSIS — J45909 Unspecified asthma, uncomplicated: Secondary | ICD-10-CM | POA: Insufficient documentation

## 2019-06-10 DIAGNOSIS — Z79899 Other long term (current) drug therapy: Secondary | ICD-10-CM | POA: Diagnosis not present

## 2019-06-10 DIAGNOSIS — K5730 Diverticulosis of large intestine without perforation or abscess without bleeding: Secondary | ICD-10-CM | POA: Insufficient documentation

## 2019-06-10 DIAGNOSIS — F419 Anxiety disorder, unspecified: Secondary | ICD-10-CM | POA: Insufficient documentation

## 2019-06-10 DIAGNOSIS — Z1211 Encounter for screening for malignant neoplasm of colon: Secondary | ICD-10-CM | POA: Insufficient documentation

## 2019-06-10 DIAGNOSIS — Z7951 Long term (current) use of inhaled steroids: Secondary | ICD-10-CM | POA: Diagnosis not present

## 2019-06-10 HISTORY — DX: Other complications of anesthesia, initial encounter: T88.59XA

## 2019-06-10 HISTORY — PX: COLONOSCOPY WITH PROPOFOL: SHX5780

## 2019-06-10 SURGERY — COLONOSCOPY WITH PROPOFOL
Anesthesia: Monitor Anesthesia Care

## 2019-06-10 MED ORDER — PROPOFOL 500 MG/50ML IV EMUL
INTRAVENOUS | Status: AC
  Filled 2019-06-10: qty 50

## 2019-06-10 MED ORDER — LACTATED RINGERS IV SOLN
INTRAVENOUS | Status: DC
  Administered 2019-06-10: 1000 mL via INTRAVENOUS

## 2019-06-10 MED ORDER — PROPOFOL 500 MG/50ML IV EMUL
INTRAVENOUS | Status: DC | PRN
  Administered 2019-06-10: 150 ug/kg/min via INTRAVENOUS

## 2019-06-10 MED ORDER — LIDOCAINE HCL 1 % IJ SOLN
INTRAMUSCULAR | Status: DC | PRN
  Administered 2019-06-10: 50 mg via INTRADERMAL

## 2019-06-10 MED ORDER — SODIUM CHLORIDE 0.9 % IV SOLN: INTRAVENOUS | Status: DC

## 2019-06-10 MED ORDER — PROPOFOL 10 MG/ML IV BOLUS
INTRAVENOUS | Status: DC | PRN
  Administered 2019-06-10 (×3): 10 mg via INTRAVENOUS

## 2019-06-10 SURGICAL SUPPLY — 22 items

## 2019-06-10 NOTE — Transfer of Care (Signed)
Immediate Anesthesia Transfer of Care Note  Patient: Willie Jenkins  Procedure(s) Performed: COLONOSCOPY WITH PROPOFOL (N/A )  Patient Location: PACU and Endoscopy Unit  Anesthesia Type:MAC  Level of Consciousness: oriented, drowsy and patient cooperative  Airway & Oxygen Therapy: Patient Spontanous Breathing and Patient connected to face mask oxygen  Post-op Assessment: Report given to RN and Post -op Vital signs reviewed and stable  Post vital signs: Reviewed and stable  Last Vitals:  Vitals Value Taken Time  BP 100/69 06/10/19 0803  Temp    Pulse 72 06/10/19 0806  Resp 23 06/10/19 0806  SpO2 99 % 06/10/19 0806  Vitals shown include unvalidated device data.  Last Pain:  Vitals:   06/10/19 0705  TempSrc: Oral  PainSc: 0-No pain         Complications: No apparent anesthesia complications

## 2019-06-10 NOTE — Op Note (Signed)
[facility] Patient Name: Willie Jenkins Procedure Date: 06/10/2019 MRN: [id number] Attending MD: Juanita Craver , MD Date of Birth: 07-Jun-1965 CSN: [id number] Age: 54 Admit Type: Outpatient Procedure:                Screening colonoscopy. Indications:              CRC screening for colorectal malignant neoplasm. Providers:                Juanita Craver, MD, Cleda Daub, RN, Elspeth Cho                            Tech., Technician, Karis Juba, CRNA Referring MD:             Adrienne Mocha, [facility] Medicines:                Monitored Anesthesia Care. Complications:            No immediate complications. Estimated Blood Loss:     Estimated blood loss: none. Procedure:                Pre-Anesthesia Assessment: - Prior to the                            procedure, a history and physical was performed,                            and patient medications and allergies were                            reviewed. The patient's tolerance of previous                            anesthesia was also reviewed. The risks and                            benefits of the procedure and the sedation options                            and risks were discussed with the patient. All                            questions were answered, and informed consent was                            obtained. Prior Anticoagulants: The patient has                            taken no previous anticoagulant or antiplatelet                            agents. ASA Grade Assessment: II - A patient with                            mild systemic disease. After reviewing the risks  and benefits, the patient was deemed in                            satisfactory condition to undergo the procedure.                            After obtaining informed consent, the colonoscope                            was passed under direct vision. Throughout the                            procedure, the patient's blood  pressure, pulse, and                            oxygen saturations were monitored continuously. The                            CF-HQ190L ([id number]) Olympus colonoscope was                            introduced through the anus and advanced to the the                            cecum, identified by appendiceal orifice and                            ileocecal valve. The colonoscopy was performed                            without difficulty. The patient tolerated the                            procedure well. The quality of the bowel                            preparation was good. The terminal ileum, the                            ileocecal valve, the appendiceal orifice and the                            rectum were photographed. The bowel preparation                            used was SUPREP via split dose instruction. Scope In: 7:39:38 AM Scope Out: 7:54:56 AM Scope Withdrawal Time: 0 hours 7 minutes 52 seconds  Total Procedure Duration: 0 hours 15 minutes 18 seconds  Findings:      A few diverticula were found in the entire colon; there was inspissated       stool in some of the diverticula.      The terminal ileum appeared normal.      No additional abnormalities were found on retroflexion. Impression:               -  Scattered diverticulosis in the entire examined                            colon.                           - The examined portion of the ileum was normal.                           - No specimens collected. Moderate Sedation:      MAC used. Recommendation:           - High fiber diet with augmented water consumption                            daily.                           - Continue present medications.                           - Repeat colonoscopy in 10 years for surveillance.                           - Return to GI office PRN.                           - If the patient has any abnormal GI symptoms in                            the interim, he has been  advised to call the office                            ASAP for further recommendations. Procedure Code(s):        --- Professional ---                           [id number], Colonoscopy, flexible; diagnostic, including                            collection of specimen(s) by brushing or washing,                            when performed (separate procedure) Diagnosis Code(s):        --- Professional ---                           Z12.11, Encounter for screening for malignant                            neoplasm of colon                           K57.30, Diverticulosis of large intestine without                            perforation or abscess  without bleeding CPT copyright 2019 American Medical Association. All rights reserved. The codes documented in this report are preliminary and upon coder review may  be revised to meet current compliance requirements. Juanita Craver, MD Juanita Craver, MD 06/10/2019 8:03:17 AM This report has been signed electronically. Number of Addenda: 0

## 2019-06-10 NOTE — Anesthesia Postprocedure Evaluation (Signed)
Anesthesia Post Note  Patient: Willie Jenkins  Procedure(s) Performed: COLONOSCOPY WITH PROPOFOL (N/A )     Patient location during evaluation: PACU Anesthesia Type: MAC Level of consciousness: awake and alert Pain management: pain level controlled Vital Signs Assessment: post-procedure vital signs reviewed and stable Respiratory status: spontaneous breathing, nonlabored ventilation and respiratory function stable Cardiovascular status: blood pressure returned to baseline and stable Postop Assessment: no apparent nausea or vomiting Anesthetic complications: no    Last Vitals:  Vitals:   06/10/19 0810 06/10/19 0821  BP: 100/71 (!) 126/92  Pulse: 66 67  Resp: (!) 23 15  Temp:    SpO2: 99% 98%    Last Pain:  Vitals:   06/10/19 0821  TempSrc:   PainSc: 0-No pain                 Pervis Hocking

## 2019-06-10 NOTE — H&P (Signed)
Willie Jenkins is an 54 y.o. male.   Chief Complaint: Colorectal cancer screening. HPI: Willie Jenkins is a 54 year old white male who presents to [facility] for screening colonoscopy.  He has a history of difficult intubation and difficult emergence from anesthesia when he had surgery for his inguinal hernia.  He denies having any abdominal pain nausea vomiting diarrhea constipation.  He has a good appetite and his weight has been stable there is no known family history of colon cancer celiac sprue or IBD  Past Medical History:  Diagnosis Date  . Complication of anesthesia    had breathing problems during general anesthesia  . Hyperlipidemia    No past surgical history on file.  No family history on file. Social History:  reports that he has never smoked. He has quit using smokeless tobacco. He reports current alcohol use. No history on file for drug.  Allergies: No Known Allergies  Medications Prior to Admission  Medication Sig Dispense Refill  . albuterol (PROAIR HFA) 108 (90 BASE) MCG/ACT inhaler Inhale 2 puffs into the lungs every 4 (four) hours as needed. 1 Inhaler 6  . fluticasone (FLONASE) 50 MCG/ACT nasal spray Place 2 sprays into both nostrils daily.    . Mometasone Furo-Formoterol Fum (DULERA) 200-5 MCG/ACT AERO Take 2 puffs first thing in am and then another 2 puffs about 12 hours later. 1 Inhaler 11  . mometasone-formoterol (DULERA) 200-5 MCG/ACT AERO Take 2 puffs first thing in am and then another 2 puffs about 12 hours later. 1 Inhaler 2  . predniSONE (DELTASONE) 10 MG tablet Take  4 each am x 2 days,   2 each am x 2 days,  1 each am x 2 days and stop 14 tablet 0    No results found for this or any previous visit (from the past 48 hour(s)). No results found.  Review of Systems  Constitutional: Negative.   HENT: Negative.   Eyes: Negative.   Respiratory: Negative.   Cardiovascular: Negative.   Gastrointestinal: Negative.   Endocrine: Negative.    Genitourinary: Negative.   Musculoskeletal: Negative.   Allergic/Immunologic: Negative.   Neurological: Negative.   Hematological: Negative.   Psychiatric/Behavioral: Negative.    Blood pressure 112/88, pulse 72, temperature 97.9 F (36.6 C), temperature source Oral, resp. rate 20, height 5\' 11"  (1.803 m), weight 99.3 kg, SpO2 97 %. Physical Exam  Constitutional: He is oriented to person, place, and time. He appears well-developed and well-nourished.  HENT:  Head: Normocephalic and atraumatic.  Eyes: Pupils are equal, round, and reactive to light. Conjunctivae and EOM are normal.  Cardiovascular: Normal rate and regular rhythm.  Musculoskeletal:        General: Normal range of motion.     Cervical back: Normal range of motion and neck supple.  Neurological: He is alert and oriented to person, place, and time.  Skin: Skin is warm and dry.  Psychiatric: He has a normal mood and affect. His behavior is normal. Judgment and thought content normal.    Assessment/Plan Colorectal cancer screening-proceed with a colonoscopy at this time.  CRNA providing care today for the patient has been informed about his history of difficult intubation and difficult emergency room anesthesia noted on previous anesthesia records reviewed received by my office.  This has been discussed in great detail with the patient and all his questions have been answered to satisfaction.  , MD 06/10/2019, 7:24 AM

## 2019-06-10 NOTE — Discharge Instructions (Signed)

## 2019-06-12 ENCOUNTER — Encounter: Payer: Self-pay | Admitting: *Deleted

## 2019-07-19 ENCOUNTER — Ambulatory Visit: Payer: BC Managed Care – PPO | Attending: Internal Medicine

## 2019-07-19 DIAGNOSIS — Z23 Encounter for immunization: Secondary | ICD-10-CM

## 2019-07-19 NOTE — Progress Notes (Signed)
   Covid-19 Vaccination Clinic  Name:  NYKEEM CITRO    MRN: [id number] DOB: [birth year]  07/19/2019  Mr. Vick was observed post Covid-19 immunization for 15 minutes without incident. He was provided with Vaccine Information Sheet and instruction to access the V-Safe system.   Mr. Corbit was instructed to call 911 with any severe reactions post vaccine: Marland Kitchen Difficulty breathing  . Swelling of face and throat  . A fast heartbeat  . A bad rash all over body  . Dizziness and weakness   Immunizations Administered    Name Date Dose VIS Date Route   Pfizer COVID-19 Vaccine 07/19/2019  8:08 AM 0.3 mL 03/28/2019 Intramuscular   Manufacturer: [employer], [employer]   Lot: [id number]   NDC: [address]-4

## 2019-08-13 ENCOUNTER — Ambulatory Visit: Payer: BC Managed Care – PPO

## 2019-08-18 ENCOUNTER — Ambulatory Visit: Payer: BC Managed Care – PPO

## 2019-08-20 ENCOUNTER — Ambulatory Visit: Payer: BC Managed Care – PPO | Attending: Internal Medicine

## 2019-08-20 DIAGNOSIS — Z23 Encounter for immunization: Secondary | ICD-10-CM

## 2019-08-20 NOTE — Progress Notes (Signed)
   Covid-19 Vaccination Clinic  Name:  Willie Jenkins    MRN: [id number] DOB: 06-17-1965  08/20/2019  [name] was observed post Covid-19 immunization for 15 minutes without incident. He was provided with Vaccine Information Sheet and instruction to access the V-Safe system.   [name] was instructed to call 911 with any severe reactions post vaccine: Marland Kitchen Difficulty breathing  . Swelling of face and throat  . A fast heartbeat  . A bad rash all over body  . Dizziness and weakness   Immunizations Administered    Name Date Dose VIS Date Route   Pfizer COVID-19 Vaccine 08/20/2019  8:39 AM 0.3 mL 06/11/2018 Intramuscular   Manufacturer: [employer], [employer]   Lot: [id number]   NDC: [address]-8

## 2020-01-08 ENCOUNTER — Encounter: Payer: Self-pay | Admitting: Pulmonary Disease

## 2020-01-08 ENCOUNTER — Ambulatory Visit: Payer: BC Managed Care – PPO | Admitting: Pulmonary Disease

## 2020-01-08 ENCOUNTER — Other Ambulatory Visit: Payer: Self-pay

## 2020-01-08 VITALS — BP 130/80 | HR 88 | Temp 95.3°F | Ht 70.87 in | Wt 229.8 lb

## 2020-01-08 DIAGNOSIS — G4733 Obstructive sleep apnea (adult) (pediatric): Secondary | ICD-10-CM | POA: Diagnosis not present

## 2020-01-08 NOTE — Patient Instructions (Signed)
Schedule home sleep study. We discussed treatment options 

## 2020-01-08 NOTE — Assessment & Plan Note (Addendum)
Given excessive daytime somnolence, narrow pharyngeal exam, witnessed apneas & loud snoring, obstructive sleep apnea is possible & an overnight polysomnogram will be scheduled as a home study. The pathophysiology of obstructive sleep apnea , it's cardiovascular consequences & modes of treatment including CPAP were discused with the patient in detail & they evidenced understanding.  Red flags are nonrefreshing sleep, Difficulty recovery from anesthesia and persistent sore throat which could be related to snoring.  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

## 2020-01-08 NOTE — Progress Notes (Signed)
Subjective:    Patient ID: Willie Jenkins, male    DOB: [birth year], 54 y.o.   MRN: [id number]  HPI   Chief Complaint  Patient presents with   Consult    Patient states that he snores, has trouble going to sleep and staying asleep, wakes up at night depends on how often, goes to bed tired and wakes up tired. 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  His wife has noted snoring for many years and teeth grinding but he could not tolerate a mouthguard.  He reports frequent nocturnal awakenings and feeling tired in the morning and especially in the afternoons.  He reports nonrefreshing sleep in spite of adequate sleep quality.  Also  reports frequent sore throat. He admits to stressors at work but this is something he has dealt with his own life. Epworth sleepiness score is 9 and he reports sleepiness while sitting and reading, watching TV or as a passenger in a car or lying down to rest in the afternoons.  On weekends he will often take a nap on the couch. Bedtime is around 10 PM, sleep latency can be 10 minutes to an hour, he sleeps in all positions on a controlled pillow and has a sleep number bed.  He reports 2-3 nocturnal awakenings and sometimes has longer sleep latency and will stay awake for about an hour on the couch watching TV, is fairly out of bed at 6 AM feeling tired but denies headaches or dryness of mouth  He has gained about 20 pounds in the last few years. He reports appendicitis surgery in 2019 and had difficulty recovering from anesthesia, needed a longer time to wake up.  He chews tobacco about once or twice a year, grew up in a farm, drinks alcohol 2-3 times per week. Father had heart disease and OSA   PMH -evaluated for asthma 2015 and new cough felt to be due to GERD   Past Medical History:  Diagnosis Date   Complication of  anesthesia    had breathing problems during general anesthesia   Hyperlipidemia    Past Surgical History:  Procedure Laterality Date   COLONOSCOPY WITH PROPOFOL N/A 06/10/2019   Procedure: COLONOSCOPY WITH PROPOFOL;  Surgeon: Charna Elizabeth, MD;  Location: [facility];  Service: Endoscopy;  Laterality: N/A;    No Known Allergies  Social History   Socioeconomic History   Marital status: Married    Spouse name: Not on file   Number of children: Not on file   Years of education: Not on file   Highest education level: Not on file  Occupational History   Not on file  Tobacco Use   Smoking status: Never Smoker   Smokeless tobacco: Former Forensic psychologist Use: Never used  Substance and Sexual Activity   Alcohol use: Yes   Drug use: Not on file   Sexual activity: Not on file  Other Topics Concern   Not on file  Social History Narrative   Not on file   Social Determinants of Health   Financial Resource Strain:    Difficulty of Paying Living Expenses: Not on file  Food Insecurity:    Worried About Running Out of Food in the Last Year: Not on file   [employer] of Food in the Last Year: Not on file  Transportation  Needs:    Lack of Transportation (Medical): Not on file   Lack of Transportation (Non-Medical): Not on file  Physical Activity:    Days of Exercise per Week: Not on file   Minutes of Exercise per Session: Not on file  Stress:    Feeling of Stress : Not on file  Social Connections:    Frequency of Communication with Friends and Family: Not on file   Frequency of Social Gatherings with Friends and Family: Not on file   Attends Religious Services: Not on file   Active Member of Clubs or Organizations: Not on file   Attends Banker Meetings: Not on file   Marital Status: Not on file  Intimate Partner Violence:    Fear of Current or Ex-Partner: Not on file   Emotionally Abused: Not on file   Physically Abused: Not  on file   Sexually Abused: Not on file    Family History  Problem Relation Age of Onset   Sleep apnea Father      Review of Systems     Objective:   Physical Exam  Gen. Pleasant, well-nourished, in no distress, normal affect ENT - no pallor,icterus, no post nasal drip, deviated septum to right, nml uvula Neck: No JVD, no thyromegaly, no carotid bruits Lungs: no use of accessory muscles, no dullness to percussion, clear without rales or rhonchi  Cardiovascular: Rhythm regular, heart sounds  normal, no murmurs or gallops, no peripheral edema Abdomen: soft and non-tender, no hepatosplenomegaly, BS normal. Musculoskeletal: No deformities, no cyanosis or clubbing Neuro:  alert, non focal       Assessment & Plan:

## 2020-02-23 ENCOUNTER — Encounter: Payer: Self-pay | Admitting: Pulmonary Disease

## 2020-02-27 ENCOUNTER — Ambulatory Visit: Payer: BC Managed Care – PPO

## 2020-02-27 ENCOUNTER — Other Ambulatory Visit: Payer: Self-pay

## 2020-02-27 DIAGNOSIS — G4733 Obstructive sleep apnea (adult) (pediatric): Secondary | ICD-10-CM

## 2020-02-27 DIAGNOSIS — G4710 Hypersomnia, unspecified: Secondary | ICD-10-CM

## 2020-03-09 ENCOUNTER — Telehealth: Payer: Self-pay | Admitting: Pulmonary Disease

## 2020-03-09 DIAGNOSIS — G4710 Hypersomnia, unspecified: Secondary | ICD-10-CM | POA: Diagnosis not present

## 2020-03-09 NOTE — Telephone Encounter (Signed)
HST showed no evidence of significant OSA Mild snoring was noted No interventions recommended other than maintaining ideal BMI between 25-30

## 2020-03-15 NOTE — Telephone Encounter (Signed)
Called and went over HST result per Dr Vassie Loll with patient. All questions answered and patient expressed full understanding of result and of Dr Reginia Naas recommendations. Nothing further needed at this time.

## 2021-03-21 ENCOUNTER — Ambulatory Visit: Payer: BC Managed Care – PPO | Admitting: Primary Care

## 2021-03-21 ENCOUNTER — Encounter: Payer: Self-pay | Admitting: Primary Care

## 2021-03-21 ENCOUNTER — Other Ambulatory Visit: Payer: Self-pay

## 2021-03-21 DIAGNOSIS — J4521 Mild intermittent asthma with (acute) exacerbation: Secondary | ICD-10-CM | POA: Diagnosis not present

## 2021-03-21 DIAGNOSIS — K219 Gastro-esophageal reflux disease without esophagitis: Secondary | ICD-10-CM | POA: Diagnosis not present

## 2021-03-21 MED ORDER — PREDNISONE 10 MG PO TABS: ORAL_TABLET | ORAL | 0 refills | Status: AC

## 2021-03-21 MED ORDER — MOMETASONE FURO-FORMOTEROL FUM 100-5 MCG/ACT IN AERO: 2.0000 | INHALATION_SPRAY | Freq: Two times a day (BID) | RESPIRATORY_TRACT | 5 refills | Status: DC

## 2021-03-21 NOTE — Patient Instructions (Addendum)
Recommendations: - Resume Dulera 100mg  one puff twice daily x2 weeks, then use as needed (rinse mouth after use) - Take prednisone taper as directed - If cough does not improve start back on famotidine (pepcid) 20mg  at bedtime and follow GERD diet  Follow-up: - 6 months with Dr. or sooner if needed   Asthma, Adult Asthma is a long-term (chronic) condition in which the airways get tight and narrow. The airways are the breathing passages that lead from the nose and mouth down into the lungs. A person with asthma will have times when symptoms get worse. These are called asthma attacks. They can cause coughing, whistling sounds when you breathe (wheezing), shortness of breath, and chest pain. They can make it hard to breathe. There is no cure for asthma, but medicines and lifestyle changes can help control it. There are many things that can bring on an asthma attack or make asthma symptoms worse (triggers). Common triggers include: Mold. Dust. Cigarette smoke. Cockroaches. Things that can cause allergy symptoms (allergens). These include animal skin flakes (dander) and pollen from trees or grass. Things that pollute the air. These may include household cleaners, wood smoke, smog, or chemical odors. Cold air, weather changes, and wind. Crying or laughing hard. Stress. Certain medicines or drugs. Certain foods such as dried fruit, potato chips, and grape juice. Infections, such as a cold or the flu. Certain medical conditions or diseases. Exercise or tiring activities. Asthma may be treated with medicines and by staying away from the things that cause asthma attacks. Types of medicines may include: Controller medicines. These help prevent asthma symptoms. They are usually taken every day. Fast-acting reliever or rescue medicines. These quickly relieve asthma symptoms. They are used as needed and provide short-term relief. Allergy medicines if your attacks are brought on by  allergens. Medicines to help control the body's defense (immune) system. Follow these instructions at home: Avoiding triggers in your home Change your heating and air conditioning filter often. Limit your use of fireplaces and wood stoves. Get rid of pests (such as roaches and mice) and their droppings. Throw away plants if you see mold on them. Clean your floors. Dust regularly. Use cleaning products that do not smell. Have someone vacuum when you are not home. Use a vacuum cleaner with a HEPA filter if possible. Replace carpet with wood, tile, or vinyl flooring. Carpet can trap animal skin flakes and dust. Use allergy-proof pillows, mattress covers, and box spring covers. Wash bed sheets and blankets every week in hot water. Dry them in a dryer. Keep your bedroom free of any triggers. Avoid pets and keep windows closed when things that cause allergy symptoms are in the air. Use blankets that are made of polyester or cotton. Clean bathrooms and kitchens with bleach. If possible, have someone repaint the walls in these rooms with mold-resistant paint. Keep out of the rooms that are being cleaned and painted. Wash your hands often with soap and water. If soap and water are not available, use hand sanitizer. Do not allow anyone to smoke in your home. General instructions Take over-the-counter and prescription medicines only as told by your doctor. Talk with your doctor if you have questions about how or when to take your medicines. Make note if you need to use your medicines more often than usual. Do not use any products that contain nicotine or tobacco, such as cigarettes and e-cigarettes. If you need help quitting, ask your doctor. Stay away from secondhand smoke. Avoid doing things  outdoors when allergen counts are high and when air quality is low. Wear a ski mask when doing outdoor activities in the winter. The mask should cover your nose and mouth. Exercise indoors on cold days if you  can. Warm up before you exercise. Take time to cool down after exercise. Use a peak flow meter as told by your doctor. A peak flow meter is a tool that measures how well the lungs are working. Keep track of the peak flow meter's readings. Write them down. Follow your asthma action plan. This is a written plan for taking care of your asthma and treating your attacks. Make sure you get all the shots (vaccines) that your doctor recommends. Ask your doctor about a flu shot and a pneumonia shot. Keep all follow-up visits as told by your doctor. This is important. Contact a doctor if: You have wheezing, shortness of breath, or a cough even while taking medicine to prevent attacks. The mucus you cough up (sputum) is thicker than usual. The mucus you cough up changes from clear or white to yellow, green, gray, or bloody. You have problems from the medicine you are taking, such as: A rash. Itching. Swelling. Trouble breathing. You need reliever medicines more than 2-3 times a week. Your peak flow reading is still at 50-79% of your personal best after following the action plan for 1 hour. You have a fever. Get help right away if: You seem to be worse and are not responding to medicine during an asthma attack. You are short of breath even at rest. You get short of breath when doing very little activity. You have trouble eating, drinking, or talking. You have chest pain or tightness. You have a fast heartbeat. Your lips or fingernails start to turn blue. You are light-headed or dizzy, or you faint. Your peak flow is less than 50% of your personal best. You feel too tired to breathe normally. Summary Asthma is a long-term (chronic) condition in which the airways get tight and narrow. An asthma attack can make it hard to breathe. Asthma cannot be cured, but medicines and lifestyle changes can help control it. Make sure you understand how to avoid triggers and how and when to use your  medicines. This information is not intended to replace advice given to you by your health care provider. Make sure you discuss any questions you have with your health care provider. Document Revised: 07/27/2019 Document Reviewed: 08/06/2019 Elsevier Patient Education  2022 [employer].   Food Choices for Gastroesophageal Reflux Disease, Adult When you have gastroesophageal reflux disease (GERD), the foods you eat and your eating habits are very important. Choosing the right foods can help ease your discomfort. Think about working with a food expert (dietitian) to help you make good choices. What are tips for following this plan? Reading food labels Look for foods that are low in saturated fat. Foods that may help with your symptoms include: Foods that have less than 5% of daily value (DV) of fat. Foods that have 0 grams of trans fat. Cooking Do not fry your food. Cook your food by baking, steaming, grilling, or broiling. These are all methods that do not need a lot of fat for cooking. To add flavor, try to use herbs that are low in spice and acidity. Meal planning  Choose healthy foods that are low in fat, such as: Fruits and vegetables. Whole grains. Low-fat dairy products. Lean meats, fish, and poultry. Eat small meals often instead of eating 3  large meals each day. Eat your meals slowly in a place where you are relaxed. Avoid bending over or lying down until 2-3 hours after eating. Limit high-fat foods such as fatty meats or fried foods. Limit your intake of fatty foods, such as oils, butter, and shortening. Avoid the following as told by your doctor: Foods that cause symptoms. These may be different for different people. Keep a food diary to keep track of foods that cause symptoms. Alcohol. Drinking a lot of liquid with meals. Eating meals during the 2-3 hours before bed. Lifestyle Stay at a healthy weight. Ask your doctor what weight is healthy for you. If you need to lose  weight, work with your doctor to do so safely. Exercise for at least 30 minutes on 5 or more days each week, or as told by your doctor. Wear loose-fitting clothes. Do not smoke or use any products that contain nicotine or tobacco. If you need help quitting, ask your doctor. Sleep with the head of your bed higher than your feet. Use a wedge under the mattress or blocks under the bed frame to raise the head of the bed. Chew sugar-free gum after meals. What foods should eat? Eat a healthy, well-balanced diet of fruits, vegetables, whole grains, low-fat dairy products, lean meats, fish, and poultry. Each person is different. Foods that may cause symptoms in one person may not cause any symptoms in another person. Work with your doctor to find foods that are safe for you. The items listed above may not be a complete list of what you can eat and drink. Contact a food expert for more options. What foods should I avoid? Limiting some of these foods may help in managing the symptoms of GERD. Everyone is different. Talk with a food expert or your doctor to help you find the exact foods to avoid, if any. Fruits Any fruits prepared with added fat. Any fruits that cause symptoms. For some people, this may include citrus fruits, such as oranges, grapefruit, pineapple, and lemons. Vegetables Deep-fried vegetables. [place] fries. Any vegetables prepared with added fat. Any vegetables that cause symptoms. For some people, this may include tomatoes and tomato products, chili peppers, onions and garlic, and horseradish. Grains Pastries or quick breads with added fat. Meats and other proteins High-fat meats, such as fatty beef or pork, hot dogs, ribs, ham, sausage, salami, and bacon. Fried meat or protein, including fried fish and fried chicken. Nuts and nut butters, in large amounts. Dairy Whole milk and chocolate milk. Sour cream. Cream. Ice cream. Cream cheese. Milkshakes. Fats and oils Butter. Margarine.  Shortening. Ghee. Beverages Coffee and tea, with or without caffeine. Carbonated beverages. Sodas. Energy drinks. Fruit juice made with acidic fruits, such as orange or grapefruit. Tomato juice. Alcoholic drinks. Sweets and desserts Chocolate and cocoa. Donuts. Seasonings and condiments Pepper. Peppermint and spearmint. Added salt. Any condiments, herbs, or seasonings that cause symptoms. For some people, this may include curry, hot sauce, or vinegar-based salad dressings. The items listed above may not be a complete list of what you should not eat and drink. Contact a food expert for more options. Questions to ask your doctor Diet and lifestyle changes are often the first steps that are taken to manage symptoms of GERD. If diet and lifestyle changes do not help, talk with your doctor about taking medicines. Where to find more information International Foundation for Gastrointestinal Disorders: aboutgerd.org Summary When you have GERD, food and lifestyle choices are very important in easing your  symptoms. Eat small meals often instead of 3 large meals a day. Eat your meals slowly and in a place where you are relaxed. Avoid bending over or lying down until 2-3 hours after eating. Limit high-fat foods such as fatty meats or fried foods. This information is not intended to replace advice given to you by your health care provider. Make sure you discuss any questions you have with your health care provider. Document Revised: 10/13/2019 Document Reviewed: 10/13/2019 Elsevier Patient Education  2022 [employer].

## 2021-03-21 NOTE — Assessment & Plan Note (Addendum)
-   Hx childhood asthma. Current symptoms are consistent with mild intermittent asthma with acute exacerbation. Patient develop reactive dry cough 1 month ago. No longer on maintenance ICS/LABA. Symptoms are typically controlled by allergen avoidance and GERD diet - Recommend patient resume Dulera 100mg  one puff twice daily x2 weeks then use as needed; prednisone taper (40mg  x 2 days; 30mg  x 2 days; 20mg  x 2 days; 10mg  x 2 days) - Advised patient get covid booster and influenza vaccine when acute symptoms resolve - Follow-up 6 months with Dr. or sooner if needed

## 2021-03-21 NOTE — Assessment & Plan Note (Addendum)
-   If cough does not improve with above, advised he start famotidine (pepcid) 20mg  at bedtime and follow GERD diet

## 2021-03-21 NOTE — Progress Notes (Signed)
@Patient  ID: , male    DOB: [birth year], 55 y.o.   MRN: [id number]  Chief Complaint  Patient presents with   Follow-up    Pt is here due to a cough that is prolonged that stared in [place] in November 10th. Pt states that the he is coughing up a little bit a mucus and some chest tightness is noted at times.     Referring provider: November 12, PA  HPI: 55 year old male, never smoked. PMH OSA, asthma, hyperlipidemia. Patient of Dr. 53, last seen on 01/08/20 (formerly saw Dr. 01/10/20).   Previous [facility] pulmonary encounter: 05/14/2013 f/u ov/Wert re: recurrent cough      Chief Complaint  Patient presents with   Acute Visit      Pt c/o cough since mid Dec 2014. Cough is non prod.  He also c/o occ SOB at night. He is using rescue inhaler approx twice per wk.    Months at a time needs nothing at all including doing jet travel but sev to three times a year has dulera 200 2 every 12 hours and flonase to use prn and typically worse but not with this episode already rx with pred x one shot in Dec 2014.  Cough and breathing worse at hs despite good hfa technique   No obvious day to day or daytime variabilty or assoc   cp or chest tightness, subjective wheeze overt sinus or hb symptoms. No unusual exp hx or h/o childhood pna/ asthma or knowledge of premature birth.    Also denies any obvious fluctuation of symptoms with weather or environmental changes or other aggravating or alleviating factors except as outlined above   01/08/20- Dr. 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  His wife has noted snoring for many years and teeth grinding but he could not tolerate a mouthguard.  He reports frequent nocturnal awakenings and feeling tired in the morning and especially in the afternoons.  He reports nonrefreshing sleep in spite of adequate sleep quality.  Also  reports frequent sore throat. He admits to stressors at work but this is  something he has dealt with his own life. Epworth sleepiness score is 9 and he reports sleepiness while sitting and reading, watching TV or as a passenger in a car or lying down to rest in the afternoons.  On weekends he will often take a nap on the couch. Bedtime is around 10 PM, sleep latency can be 10 minutes to an hour, he sleeps in all positions on a controlled pillow and has a sleep number bed.  He reports 2-3 nocturnal awakenings and sometimes has longer sleep latency and will stay awake for about an hour on the couch watching TV, is fairly out of bed at 6 AM feeling tired but denies headaches or dryness of mouth   He has gained about 20 pounds in the last few years. He reports appendicitis surgery in 2019 and had difficulty recovering from anesthesia, needed a longer time to wake up.   He chews tobacco about once or twice a year, grew up [place] a farm, drinks alcohol 2-3 times per week. Father had heart disease and OSA     PMH -evaluated for asthma 2015 and new cough felt to be due to GERD   03/21/2021- interim hx  Patient presents today for acute visit/cough. He has hx childhood asthma. He is no longer on Dulera 14/08/2020. Symptoms are typically well controlled with allergen  avoidance. Cough started in early November after trip to [facility]. Cough is mostly dry, occasionally productive with clear mucus. He has some associated chest tightness and post nasal drip. He was seen by primary care provider at [facility] 2 weeks ago and was given steriod shot and RX albuterol HFA. He had two covid shots but has not received booster. Denies overt reflux symptoms, he limits red wine and chocolate. No longer on PPI/H2 blocker.   Pulmonary function testing: 08/25/11 Spriometry >> FVC 4.3 (84%), FEV1 3.6 (89%), ratio 85   No Known Allergies  Immunization History  Administered Date(s) Administered   Influenza-Unspecified 02/14/2019   PFIZER(Purple Top)SARS-COV-2 Vaccination 07/19/2019, 08/20/2019   Td  04/01/2019   Zoster Recombinat (Shingrix) 04/01/2019, 07/01/2019    Past Medical History:  Diagnosis Date   Complication of anesthesia    had breathing problems during general anesthesia   Hyperlipidemia     Tobacco History: Social History   Tobacco Use  Smoking Status Never  Smokeless Tobacco Former   Counseling given: Not Answered   Outpatient Medications Prior to Visit  Medication Sig Dispense Refill   albuterol (VENTOLIN HFA) 108 (90 Base) MCG/ACT inhaler Inhale into the lungs.     rosuvastatin (CRESTOR) 20 MG tablet Take 20 mg by mouth at bedtime.     sertraline (ZOLOFT) 50 MG tablet Take 50 mg by mouth daily.     No facility-administered medications prior to visit.    Review of Systems  Review of Systems  Constitutional: Negative.   HENT:  Positive for postnasal drip.   Respiratory:  Positive for cough. Negative for chest tightness, shortness of breath and wheezing.   Cardiovascular: Negative.     Physical Exam  BP 118/64 (BP Location: Left Arm, Patient Position: Sitting, Cuff Size: Normal)   Pulse (!) 110   Temp 98.3 F (36.8 C) (Oral)   Ht 5\' 11"  (1.803 m)   Wt 229 lb 12.8 oz (104.2 kg)   SpO2 97%   BMI 32.05 kg/m  Physical Exam Constitutional:      Appearance: Normal appearance.  HENT:     Head: Normocephalic and atraumatic.     Mouth/Throat:     Mouth: Mucous membranes are moist.     Pharynx: Oropharynx is clear.  Cardiovascular:     Rate and Rhythm: Normal rate and regular rhythm.  Pulmonary:     Effort: Pulmonary effort is normal.     Breath sounds: Normal breath sounds. No wheezing, rhonchi or rales.     Comments: Persistent reactive cough Musculoskeletal:        General: Normal range of motion.  Skin:    General: Skin is warm and dry.  Neurological:     General: No focal deficit present.     Mental Status: He is alert and oriented to person, place, and time. Mental status is at baseline.     Lab Results:  CBC No results found  for: WBC, RBC, HGB, HCT, PLT, MCV, MCH, MCHC, RDW, LYMPHSABS, MONOABS, EOSABS, BASOSABS  BMET No results found for: NA, K, CL, CO2, GLUCOSE, BUN, CREATININE, CALCIUM, GFRNONAA, GFRAA  BNP No results found for: BNP  ProBNP No results found for: PROBNP  Imaging: No results found.   Assessment & Plan:   Asthma - Hx childhood asthma. Current symptoms are consistent with mild intermittent asthma with acute exacerbation. Patient develop reactive dry cough 1 month ago. No longer on maintenance ICS/LABA. Symptoms are typically controlled by allergen avoidance and GERD diet - Recommend  patient resume Dulera 100mg  one puff twice daily x2 weeks then use as needed; prednisone taper (40mg  x 2 days; 30mg  x 2 days; 20mg  x 2 days; 10mg  x 2 days) - Advised patient get covid booster and influenza vaccine when acute symptoms resolve - Follow-up 6 months with Dr. or sooner if needed    GERD (gastroesophageal reflux disease) - If cough does not improve with above, advised he start famotidine (pepcid) 20mg  at bedtime and follow GERD diet   , NP 03/21/2021

## 2023-03-09 ENCOUNTER — Other Ambulatory Visit (INDEPENDENT_AMBULATORY_CARE_PROVIDER_SITE_OTHER): Payer: BC Managed Care – PPO

## 2023-03-09 ENCOUNTER — Encounter: Payer: Self-pay | Admitting: Orthopedic Surgery

## 2023-03-09 ENCOUNTER — Ambulatory Visit: Payer: BC Managed Care – PPO | Admitting: Orthopedic Surgery

## 2023-03-09 VITALS — BP 127/88 | HR 85 | Ht 71.0 in | Wt 237.4 lb

## 2023-03-09 DIAGNOSIS — M545 Low back pain, unspecified: Secondary | ICD-10-CM

## 2023-03-10 NOTE — Progress Notes (Signed)
Orthopedic Spine Surgery Office Note  Assessment: Patient is a 57 y.o. male with axial low back pain. No radicular symptoms   Plan: -Explained that initially conservative treatment is tried as a significant number of patients may experience relief with these treatment modalities. Discussed that the conservative treatments include:  -activity modification  -physical therapy  -over the counter pain medications  -medrol dosepak  -lumbar steroid injections -Patient has tried meloxicam   -Recommended tylenol up to 1000mg  per day. Told him to take it first thing in the morning since that is when his worst pain is. Use the meloxicam for his more significant pain days as opposed to daily use. Gave him some core exercises to work on. Referred him to PT. Encouraged weight loss.  -Patient should return to office on an as needed basis    Patient expressed understanding of the plan and all questions were answered to the patient's satisfaction.   ___________________________________________________________________________   History:  Patient is a 57 y.o. male who presents today for lumbar spine. Patient has had approximately 6 months of low back pain. He feels it around the belt line in the middle of his back. He does not have any pain radiating into either lower extremity. Pain is most days at a 3 or 4 out of 10 but sometimes it increases in severity for less than a day. He notes it sometimes is aggravated by sitting for prolonged periods of time and he has to get up and move around which helps. He most consistently feels the worst pain in the morning when getting up.    Weakness: denies Symptoms of imbalance: denies Paresthesias and numbness: denies Bowel or bladder incontinence: denies Saddle anesthesia: denies  Treatments tried: meloxicam  Review of systems: Denies fevers and chills, night sweats, unexplained weight loss, history of cancer, pain that wakes them at night  Past medical  history: Anxiety HLD GERD  Allergies: NKDA  Past surgical history:  Hernia repair Appendectomy  Social history: Denies use of nicotine product (smoking, vaping, patches, smokeless) Alcohol use: yes, approximately 3 drinks per week Denies recreational drug use   Physical Exam:  BMI of 33.1  General: no acute distress, appears stated age Neurologic: alert, answering questions appropriately, following commands Respiratory: unlabored breathing on room air, symmetric chest rise Psychiatric: appropriate affect, normal cadence to speech   MSK (spine):  -Strength exam      Left  Right EHL    5/5  5/5 TA    5/5  5/5 GSC    5/5  5/5 Knee extension  5/5  5/5 Hip flexion   5/5  5/5  -Sensory exam    Sensation intact to light touch in L3-S1 nerve distributions of bilateral lower extremities  -Achilles DTR: 2/4 on the left, 2/4 on the right -Patellar tendon DTR: 2/4 on the left, 2/4 on the right  -Straight leg raise: negative bilaterally -Femoral nerve stretch test: negative bilaterally -Clonus: no beats bilaterally  -Left hip exam: no pain through range of motion, negative stinchfield, negative faber -Right hip exam: no pain through range of motion, negative stinchfield, negative faber  Imaging: XRs of the lumbar spine from 03/09/2023 was independently reviewed and interpreted, showing disc height loss at L4/5. No other significant degenerative changes. No evidence of instability on flexion/extension views.    Patient name: Willie Jenkins Patient MRN: [id number] Date of visit: 03/09/2023

## 2024-01-10 NOTE — Progress Notes (Signed)
 This encounter was created in error - please disregard.

## 2024-02-18 ENCOUNTER — Encounter: Payer: Self-pay | Admitting: Radiology

## 2024-02-25 ENCOUNTER — Ambulatory Visit: Payer: Self-pay | Admitting: Primary Care

## 2024-02-25 ENCOUNTER — Ambulatory Visit: Admitting: Primary Care

## 2024-02-25 ENCOUNTER — Encounter: Payer: Self-pay | Admitting: Primary Care

## 2024-02-25 VITALS — BP 128/62 | HR 91 | Temp 97.5°F | Ht 69.0 in | Wt 232.8 lb

## 2024-02-25 DIAGNOSIS — J4521 Mild intermittent asthma with (acute) exacerbation: Secondary | ICD-10-CM

## 2024-02-25 DIAGNOSIS — J45901 Unspecified asthma with (acute) exacerbation: Secondary | ICD-10-CM | POA: Diagnosis not present

## 2024-02-25 DIAGNOSIS — K219 Gastro-esophageal reflux disease without esophagitis: Secondary | ICD-10-CM

## 2024-02-25 DIAGNOSIS — R051 Acute cough: Secondary | ICD-10-CM

## 2024-02-25 MED ORDER — PROMETHAZINE-DM 6.25-15 MG/5ML PO SYRP: 5.0000 mL | ORAL_SOLUTION | Freq: Four times a day (QID) | ORAL | 0 refills | Status: AC | PRN

## 2024-02-25 MED ORDER — MOMETASONE FURO-FORMOTEROL FUM 200-5 MCG/ACT IN AERO
2.0000 | INHALATION_SPRAY | Freq: Two times a day (BID) | RESPIRATORY_TRACT | 5 refills | Status: AC
Start: 2024-02-25 — End: ?

## 2024-02-25 NOTE — Patient Instructions (Signed)
  VISIT SUMMARY: During your visit, we discussed your recent respiratory symptoms following a cold, which have led to an asthma exacerbation and a dry cough. We also reviewed your history of gastroesophageal reflux disease (GERD) and its potential impact on your symptoms. Exam today was benign, vitals were normal and lungs were clear.   YOUR PLAN: -ASTHMA EXACERBATION: Asthma exacerbation means a worsening of your asthma symptoms, likely due to the weather change. You will increase your Dulera inhaler to 200mcg two puffs twice daily for at least five to seven days after your symptoms resolve. Refills for Dulera have been provided. Please monitor your symptoms and consider antibiotics if there is no improvement.  -ACUTE COUGH: An acute cough is a sudden onset of coughing, which in your case is likely related to your asthma exacerbation. You have been prescribed promethazine DM to help suppress the cough and provide an antihistamine effect. Be aware that promethazine DM can cause drowsiness.  -GASTROESOPHAGEAL REFLUX DISEASE (GERD): GERD is a condition where stomach acid frequently flows back into the tube connecting your mouth and stomach, which can contribute to coughing, especially at night. If your cough worsens at night, consider taking famotidine. Continue with lifestyle modifications, including reducing alcohol intake.  INSTRUCTIONS: Please follow up if your symptoms do not improve or if they worsen. Continue monitoring your symptoms and adhere to the prescribed medication regimen. If you have any concerns or questions, do not hesitate to contact our office.

## 2024-02-25 NOTE — Telephone Encounter (Signed)
 APPT SCHEDULED FOR 02/25/24

## 2024-02-25 NOTE — Telephone Encounter (Signed)
 E2C2 Pulmonary Triage - Initial Assessment Questions "Chief Complaint (e.g., cough, sob, wheezing, fever, chills, sweat or additional symptoms) *Go to specific symptom protocol after initial questions. Fatigue and SOB  "How long have symptoms been present?" Ongoing, weeks,   "Do you monitor your oxygen levels?" No  FYI Only or Action Required?: FYI only for provider: appointment scheduled on today.  Patient was last seen in primary care on 10/16-dx bronchitis.  Called Nurse Triage reporting Shortness of Breath.  Symptoms began several weeks ago.  Symptoms are: gradually worsening.  Triage Disposition: See HCP Within 4 Hours (Or PCP Triage)  Patient/caregiver understands and will follow disposition?: Yes, will follow disposition  Copied from CRM #[id number]. Topic: Clinical - Red Word Triage >> Feb 25, 2024  8:19 AM Willie Jenkins wrote: Red Word that prompted transfer to Nurse Triage: patient has been experiencing fatigue and SOB due to bronchitis Reason for Disposition  [1] MILD difficulty breathing (e.g., minimal/no SOB at rest, SOB with walking, pulse < 100) AND [2] NEW-onset or WORSE than normal  Answer Assessment - Initial Assessment Questions 1. RESPIRATORY STATUS: Describe your breathing? (e.g., wheezing, shortness of breath, unable to speak, severe coughing)      SOB, 2. ONSET: When did this breathing problem begin?      Ongoing, worsening 3. PATTERN Does the difficult breathing come and go, or has it been constant since it started?      Ongoing, constant 4. SEVERITY: How bad is your breathing? (e.g., mild, moderate, severe)      mild 5. RECURRENT SYMPTOM: Have you had difficulty breathing before? If Yes, ask: When was the last time? and What happened that time?      Hx of bronchitis 6. CARDIAC HISTORY: Do you have any history of heart disease? (e.g., heart attack, angina, bypass surgery, angioplasty)      denies 7. LUNG HISTORY: Do you have any history of  lung disease?  (e.g., pulmonary embolus, asthma, emphysema)     bronchitis 8. CAUSE: What do you think is causing the breathing problem?      Unsure, states that hx of bronchitis 9. OTHER SYMPTOMS: Do you have any other symptoms? (e.g., chest pain, cough, dizziness, fever, runny nose)     Denies fever, denies cough, + fatigue 10. O2 SATURATION MONITOR:  Do you use an oxygen saturation monitor (pulse oximeter) at home? If Yes, ask: What is your reading (oxygen level) today? What is your usual oxygen saturation reading? (e.g., 95%)       Denies having checked  12. TRAVEL: Have you traveled out of the country in the last month? (e.g., travel history, exposures)       denies  Protocols used: Breathing Difficulty-Jenkins-AH

## 2024-02-25 NOTE — Progress Notes (Signed)
 @Patient  ID: Willie Jenkins, male    DOB: June 30, 1965, 58 y.o.   MRN: [id number]  Chief Complaint  Patient presents with   Shortness of Breath    Fatigue and sob- Saw PCP on 01-31-24 and was given a steroid shot which made things better. Pt states this past Friday, he may have developed a possible URI    Referring provider: No ref. provider found  HPI: 58 year old male, never smoked. PMH OSA, asthma, hyperlipidemia. Patient of Dr. Jude, last seen in December 2022.    02/25/2024 Discussed the use of AI scribe software for clinical note transcription with the patient, who gave verbal consent to proceed.  History of Present Illness Willie Jenkins is a 58 year old male with childhood asthma who presents with recurrent respiratory symptoms following a recent cold.  He experienced a cold starting on October 16th, which resolved but led to chest symptoms. He has a history of childhood asthma and is prone to bronchitis. He received an 80 mg IM Depo Medrol shot and dexamethasone, which initially improved his symptoms. However, last week, he noticed a recurrence of symptoms, including fatigue, a higher chest sensation, and a raspy voice, particularly worsening throughout the day.  He has not been using his Dulera inhaler, which he typically uses as needed, since about a week after the initial treatment. He also used a rescue inhaler during the initial episode. He reports a dry cough with some phlegm production, but no wheezing. Cold air exacerbates his symptoms. He has previously been on a higher dose of Dulera, which was effective.  He mentions a history of reflux, which he manages by avoiding alcohol and certain foods, noting that red wine exacerbates his symptoms. He has previously used famotidine with improvement. He underwent a sleep study in 2021, which was negative for sleep apnea.  HST in 2021 was negative for OSA, AHI 3.4/hour   No Known Allergies  Immunization History  Administered  Date(s) Administered   Influenza-Unspecified 02/14/2019   PFIZER(Purple Top)SARS-COV-2 Vaccination 07/19/2019, 08/20/2019   Td 04/01/2019   Zoster Recombinant(Shingrix) 04/01/2019, 07/01/2019    Past Medical History:  Diagnosis Date   Complication of anesthesia    had breathing problems during general anesthesia   Hyperlipidemia     Tobacco History: Social History   Tobacco Use  Smoking Status Never  Smokeless Tobacco Former   Counseling given: Not Answered   Outpatient Medications Prior to Visit  Medication Sig Dispense Refill   albuterol  (VENTOLIN  HFA) 108 (90 Base) MCG/ACT inhaler Inhale into the lungs.     mometasone -formoterol  (DULERA) 100-5 MCG/ACT AERO Inhale 2 puffs into the lungs in the morning and at bedtime. 1 each 5   rosuvastatin (CRESTOR) 20 MG tablet Take 20 mg by mouth at bedtime.     predniSONE  (DELTASONE ) 10 MG tablet 4 tabs for 2 days, then 3 tabs for 2 days, 2 tabs for 2 days, then 1 tab for 2 days, then stop (Patient not taking: Reported on 02/25/2024) 20 tablet 0   sertraline (ZOLOFT) 50 MG tablet Take 50 mg by mouth daily. (Patient not taking: Reported on 02/25/2024)     No facility-administered medications prior to visit.      Review of Systems  Review of Systems  Constitutional:  Positive for fatigue.  Respiratory:  Positive for cough and shortness of breath.      Physical Exam  BP 128/62   Pulse 91   Temp (!) 97.5 F (36.4 C)   Ht 5'  9 (1.753 m)   Wt 232 lb 12.8 oz (105.6 kg)   SpO2 95% Comment: ra  BMI 34.38 kg/m  Physical Exam Constitutional:      Appearance: Normal appearance. He is well-developed.  HENT:     Head: Normocephalic and atraumatic.     Right Ear: Tympanic membrane and ear canal normal.     Left Ear: Tympanic membrane and ear canal normal.     Mouth/Throat:     Mouth: Mucous membranes are moist.     Pharynx: Oropharynx is clear. No pharyngeal swelling, oropharyngeal exudate, posterior oropharyngeal erythema or  uvula swelling.  Cardiovascular:     Rate and Rhythm: Normal rate and regular rhythm.     Heart sounds: Normal heart sounds.  Pulmonary:     Effort: Pulmonary effort is normal. No tachypnea, accessory muscle usage or respiratory distress.     Breath sounds: Normal breath sounds. No stridor. No decreased breath sounds, wheezing or rhonchi.  Musculoskeletal:        General: Normal range of motion.     Cervical back: Normal range of motion and neck supple.  Skin:    General: Skin is warm and dry.     Findings: No erythema or rash.  Neurological:     General: No focal deficit present.     Mental Status: He is alert and oriented to person, place, and time. Mental status is at baseline.  Psychiatric:        Mood and Affect: Mood normal.        Behavior: Behavior normal.        Thought Content: Thought content normal.        Judgment: Judgment normal.     Lab Results:  CBC No results found for: WBC, RBC, HGB, HCT, PLT, MCV, MCH, MCHC, RDW, LYMPHSABS, MONOABS, EOSABS, BASOSABS  BMET No results found for: NA, K, CL, CO2, GLUCOSE, BUN, CREATININE, CALCIUM, GFRNONAA, GFRAA  BNP No results found for: BNP  ProBNP No results found for: PROBNP  Imaging: No results found.   Assessment & Plan:   No problem-specific Assessment & Plan notes found for this encounter.   There are no diagnoses linked to this encounter.  Assessment and Plan Assessment & Plan Asthma exacerbation Likely due to weather change, with symptoms of fatigue, shortness of breath, chest congestion, and voice changes. No wheezing or significant lung congestion. Previous URI in October treatment with Depo Medrol and Decadron provided initial improvement, but symptoms recurred. Dulera was previously effective, especially at a higher dose.  - Increased Dulera to 200mcg two puffs twice daily for at least five to seven days after symptoms resolve then continue prn -  Provided refills for [facility]. - Monitor symptoms and consider antibiotics if no improvement.  Acute cough Dry cough with some phlegm production, likely related to asthma exacerbation. No signs of pneumonia or significant lung congestion. CXR not indicated at this time.  - Prescribed promethazine DM for cough suppression  - Advised on potential drowsiness from promethazine DM.  Gastroesophageal reflux disease (GERD) GERD with potential contribution to cough, especially at night. Previous improvement with famotidine. Lifestyle modifications include reduced alcohol intake. - Consider adding back famotidine if cough worsens at night. - Continue lifestyle modifications, including reduced alcohol intake.   Almarie LELON Ferrari, NP 02/25/2024
